# Patient Record
Sex: Female | Born: 1955
Health system: Southern US, Community
[De-identification: ages and names within clinical notes are randomized; demographics above are authoritative.]

## PROBLEM LIST (undated history)

## (undated) DIAGNOSIS — M810 Age-related osteoporosis without current pathological fracture: Secondary | ICD-10-CM

## (undated) DIAGNOSIS — E785 Hyperlipidemia, unspecified: Secondary | ICD-10-CM

## (undated) DIAGNOSIS — C801 Malignant (primary) neoplasm, unspecified: Secondary | ICD-10-CM

## (undated) DIAGNOSIS — G2581 Restless legs syndrome: Secondary | ICD-10-CM

## (undated) HISTORY — PX: ABDOMINAL HYSTERECTOMY: SHX81

## (undated) HISTORY — PX: BREAST EXCISIONAL BIOPSY: SUR124

## (undated) HISTORY — PX: SKIN CANCER EXCISION: SHX779

## (undated) HISTORY — DX: Age-related osteoporosis without current pathological fracture: M81.0

## (undated) HISTORY — DX: Restless legs syndrome: G25.81

---

## 2002-01-26 DIAGNOSIS — C801 Malignant (primary) neoplasm, unspecified: Secondary | ICD-10-CM

## 2002-01-26 HISTORY — DX: Malignant (primary) neoplasm, unspecified: C80.1

## 2002-11-29 ENCOUNTER — Encounter (INDEPENDENT_AMBULATORY_CARE_PROVIDER_SITE_OTHER): Payer: Self-pay | Admitting: *Deleted

## 2002-11-29 ENCOUNTER — Other Ambulatory Visit: Admission: RE | Admit: 2002-11-29 | Discharge: 2002-11-29 | Payer: Self-pay | Admitting: Diagnostic Radiology

## 2002-11-29 ENCOUNTER — Encounter: Admission: RE | Admit: 2002-11-29 | Discharge: 2002-11-29 | Payer: Self-pay | Admitting: Surgery

## 2002-12-04 ENCOUNTER — Ambulatory Visit (HOSPITAL_COMMUNITY): Admission: RE | Admit: 2002-12-04 | Discharge: 2002-12-04 | Payer: Self-pay | Admitting: Oncology

## 2002-12-22 ENCOUNTER — Ambulatory Visit (HOSPITAL_COMMUNITY): Admission: RE | Admit: 2002-12-22 | Discharge: 2002-12-22 | Payer: Self-pay | Admitting: Oncology

## 2002-12-28 ENCOUNTER — Observation Stay (HOSPITAL_COMMUNITY): Admission: RE | Admit: 2002-12-28 | Discharge: 2002-12-29 | Payer: Self-pay | Admitting: Surgery

## 2002-12-28 ENCOUNTER — Encounter (INDEPENDENT_AMBULATORY_CARE_PROVIDER_SITE_OTHER): Payer: Self-pay | Admitting: Specialist

## 2003-01-31 ENCOUNTER — Ambulatory Visit (HOSPITAL_COMMUNITY): Admission: RE | Admit: 2003-01-31 | Discharge: 2003-01-31 | Payer: Self-pay | Admitting: Oncology

## 2003-02-16 ENCOUNTER — Ambulatory Visit (HOSPITAL_COMMUNITY): Admission: RE | Admit: 2003-02-16 | Discharge: 2003-02-16 | Payer: Self-pay | Admitting: Oncology

## 2003-05-30 ENCOUNTER — Encounter: Admission: RE | Admit: 2003-05-30 | Discharge: 2003-05-30 | Payer: Self-pay | Admitting: Surgery

## 2004-04-30 ENCOUNTER — Encounter: Admission: RE | Admit: 2004-04-30 | Discharge: 2004-04-30 | Payer: Self-pay | Admitting: Oncology

## 2004-05-14 ENCOUNTER — Encounter: Admission: RE | Admit: 2004-05-14 | Discharge: 2004-05-14 | Payer: Self-pay | Admitting: Family Medicine

## 2005-05-11 ENCOUNTER — Encounter: Admission: RE | Admit: 2005-05-11 | Discharge: 2005-05-11 | Payer: Self-pay | Admitting: Family Medicine

## 2006-02-25 ENCOUNTER — Ambulatory Visit: Payer: Self-pay | Admitting: Oncology

## 2006-03-22 ENCOUNTER — Ambulatory Visit (HOSPITAL_COMMUNITY): Admission: RE | Admit: 2006-03-22 | Discharge: 2006-03-22 | Payer: Self-pay | Admitting: Oncology

## 2006-03-22 LAB — COMPREHENSIVE METABOLIC PANEL
ALT: 20 U/L (ref 0–35)
AST: 23 U/L (ref 0–37)
Alkaline Phosphatase: 60 U/L (ref 39–117)
CO2: 26 mEq/L (ref 19–32)
Calcium: 10.3 mg/dL (ref 8.4–10.5)
Chloride: 104 mEq/L (ref 96–112)
Creatinine, Ser: 0.75 mg/dL (ref 0.40–1.20)
Potassium: 4.1 mEq/L (ref 3.5–5.3)
Sodium: 141 mEq/L (ref 135–145)
Total Bilirubin: 0.3 mg/dL (ref 0.3–1.2)
Total Protein: 7.7 g/dL (ref 6.0–8.3)

## 2006-03-22 LAB — CBC WITH DIFFERENTIAL/PLATELET
Basophils Absolute: 0 10*3/uL (ref 0.0–0.1)
EOS%: 0.3 % (ref 0.0–7.0)
MCHC: 35.1 g/dL (ref 32.0–36.0)
MONO%: 4.6 % (ref 0.0–13.0)
NEUT%: 68.5 % (ref 39.6–76.8)
lymph#: 2 10*3/uL (ref 0.9–3.3)

## 2006-03-22 LAB — LACTATE DEHYDROGENASE: LDH: 201 U/L (ref 94–250)

## 2006-05-17 ENCOUNTER — Encounter: Admission: RE | Admit: 2006-05-17 | Discharge: 2006-05-17 | Payer: Self-pay | Admitting: Family Medicine

## 2006-10-13 ENCOUNTER — Ambulatory Visit: Payer: Self-pay | Admitting: Oncology

## 2006-10-18 ENCOUNTER — Ambulatory Visit (HOSPITAL_COMMUNITY): Admission: RE | Admit: 2006-10-18 | Discharge: 2006-10-18 | Payer: Self-pay | Admitting: Oncology

## 2006-10-18 LAB — LACTATE DEHYDROGENASE: LDH: 187 U/L (ref 94–250)

## 2007-04-14 ENCOUNTER — Ambulatory Visit: Payer: Self-pay | Admitting: Oncology

## 2007-04-18 ENCOUNTER — Ambulatory Visit (HOSPITAL_COMMUNITY): Admission: RE | Admit: 2007-04-18 | Discharge: 2007-04-18 | Payer: Self-pay | Admitting: Oncology

## 2007-04-18 LAB — CBC WITH DIFFERENTIAL/PLATELET
EOS%: 2.2 % (ref 0.0–7.0)
HCT: 44.6 % (ref 34.8–46.6)
LYMPH%: 32.5 % (ref 14.0–48.0)
MCH: 30.7 pg (ref 26.0–34.0)
MCV: 89.7 fL (ref 81.0–101.0)
MONO#: 0.3 10*3/uL (ref 0.1–0.9)
NEUT#: 4 10*3/uL (ref 1.5–6.5)
NEUT%: 60.5 % (ref 39.6–76.8)
Platelets: 313 10*3/uL (ref 145–400)
RDW: 13.2 % (ref 11.3–14.5)
lymph#: 2.1 10*3/uL (ref 0.9–3.3)

## 2007-04-18 LAB — COMPREHENSIVE METABOLIC PANEL WITH GFR
ALT: 16 U/L (ref 0–35)
AST: 21 U/L (ref 0–37)
Albumin: 4.8 g/dL (ref 3.5–5.2)
Alkaline Phosphatase: 65 U/L (ref 39–117)
BUN: 10 mg/dL (ref 6–23)
CO2: 24 meq/L (ref 19–32)
Calcium: 10.3 mg/dL (ref 8.4–10.5)
Chloride: 103 meq/L (ref 96–112)
Creatinine, Ser: 0.89 mg/dL (ref 0.40–1.20)
Glucose, Bld: 97 mg/dL (ref 70–99)
Potassium: 4.2 meq/L (ref 3.5–5.3)
Sodium: 139 meq/L (ref 135–145)
Total Bilirubin: 0.4 mg/dL (ref 0.3–1.2)
Total Protein: 7.9 g/dL (ref 6.0–8.3)

## 2007-10-13 ENCOUNTER — Ambulatory Visit: Payer: Self-pay | Admitting: Oncology

## 2007-10-17 ENCOUNTER — Ambulatory Visit (HOSPITAL_COMMUNITY): Admission: RE | Admit: 2007-10-17 | Discharge: 2007-10-17 | Payer: Self-pay | Admitting: Oncology

## 2007-10-17 LAB — CBC WITH DIFFERENTIAL/PLATELET
BASO%: 0.6 % (ref 0.0–2.0)
Basophils Absolute: 0 10*3/uL (ref 0.0–0.1)
Eosinophils Absolute: 0.1 10*3/uL (ref 0.0–0.5)
HGB: 14.9 g/dL (ref 11.6–15.9)
NEUT#: 4 10*3/uL (ref 1.5–6.5)
NEUT%: 61.9 % (ref 39.6–76.8)
Platelets: 260 10*3/uL (ref 145–400)
WBC: 6.4 10*3/uL (ref 3.9–10.0)

## 2007-10-17 LAB — COMPREHENSIVE METABOLIC PANEL
ALT: 15 U/L (ref 0–35)
AST: 18 U/L (ref 0–37)
CO2: 24 mEq/L (ref 19–32)
Calcium: 9.5 mg/dL (ref 8.4–10.5)
Chloride: 103 mEq/L (ref 96–112)
Glucose, Bld: 94 mg/dL (ref 70–99)
Potassium: 4.4 mEq/L (ref 3.5–5.3)
Total Bilirubin: 0.4 mg/dL (ref 0.3–1.2)
Total Protein: 7.6 g/dL (ref 6.0–8.3)

## 2007-11-14 ENCOUNTER — Encounter: Admission: RE | Admit: 2007-11-14 | Discharge: 2007-11-14 | Payer: Self-pay | Admitting: Oncology

## 2008-10-11 ENCOUNTER — Ambulatory Visit: Payer: Self-pay | Admitting: Oncology

## 2008-10-15 ENCOUNTER — Ambulatory Visit (HOSPITAL_COMMUNITY): Admission: RE | Admit: 2008-10-15 | Discharge: 2008-10-15 | Payer: Self-pay | Admitting: Oncology

## 2008-10-15 LAB — COMPREHENSIVE METABOLIC PANEL
ALT: 19 U/L (ref 0–35)
AST: 21 U/L (ref 0–37)
BUN: 12 mg/dL (ref 6–23)
CO2: 26 mEq/L (ref 19–32)
Calcium: 9.6 mg/dL (ref 8.4–10.5)
Creatinine, Ser: 0.87 mg/dL (ref 0.40–1.20)
Potassium: 4.6 mEq/L (ref 3.5–5.3)
Total Protein: 7.5 g/dL (ref 6.0–8.3)

## 2008-10-15 LAB — CBC WITH DIFFERENTIAL/PLATELET
Eosinophils Absolute: 0.2 10*3/uL (ref 0.0–0.5)
LYMPH%: 30.8 % (ref 14.0–49.7)
MCH: 31.1 pg (ref 25.1–34.0)
MCHC: 33.8 g/dL (ref 31.5–36.0)
MCV: 92.1 fL (ref 79.5–101.0)
MONO#: 0.5 10*3/uL (ref 0.1–0.9)
MONO%: 6.8 % (ref 0.0–14.0)
NEUT%: 59.1 % (ref 38.4–76.8)
Platelets: 277 10*3/uL (ref 145–400)
WBC: 6.8 10*3/uL (ref 3.9–10.3)
lymph#: 2.1 10*3/uL (ref 0.9–3.3)

## 2008-10-15 LAB — LACTATE DEHYDROGENASE: LDH: 207 U/L (ref 94–250)

## 2008-11-14 ENCOUNTER — Encounter: Admission: RE | Admit: 2008-11-14 | Discharge: 2008-11-14 | Payer: Self-pay | Admitting: Family Medicine

## 2009-02-19 ENCOUNTER — Encounter: Admission: RE | Admit: 2009-02-19 | Discharge: 2009-02-19 | Payer: Self-pay | Admitting: Family Medicine

## 2009-10-02 ENCOUNTER — Ambulatory Visit: Payer: Self-pay | Admitting: Oncology

## 2009-10-30 ENCOUNTER — Ambulatory Visit (HOSPITAL_COMMUNITY): Admission: RE | Admit: 2009-10-30 | Discharge: 2009-10-30 | Payer: Self-pay | Admitting: Oncology

## 2009-10-30 LAB — COMPREHENSIVE METABOLIC PANEL
ALT: 22 U/L (ref 0–35)
AST: 28 U/L (ref 0–37)
CO2: 24 mEq/L (ref 19–32)
Creatinine, Ser: 0.87 mg/dL (ref 0.40–1.20)
Glucose, Bld: 91 mg/dL (ref 70–99)
Sodium: 140 mEq/L (ref 135–145)
Total Bilirubin: 0.3 mg/dL (ref 0.3–1.2)
Total Protein: 6.8 g/dL (ref 6.0–8.3)

## 2009-10-30 LAB — CBC WITH DIFFERENTIAL/PLATELET
EOS%: 2.7 % (ref 0.0–7.0)
Eosinophils Absolute: 0.2 10*3/uL (ref 0.0–0.5)
HCT: 41.4 % (ref 34.8–46.6)
HGB: 14.3 g/dL (ref 11.6–15.9)
LYMPH%: 32 % (ref 14.0–49.7)
MCH: 32.1 pg (ref 25.1–34.0)
MCHC: 34.4 g/dL (ref 31.5–36.0)
NEUT#: 4.2 10*3/uL (ref 1.5–6.5)
RDW: 13.2 % (ref 11.2–14.5)

## 2009-10-30 LAB — LACTATE DEHYDROGENASE: LDH: 210 U/L (ref 94–250)

## 2009-11-01 ENCOUNTER — Ambulatory Visit: Payer: Self-pay | Admitting: Oncology

## 2009-11-20 ENCOUNTER — Encounter: Admission: RE | Admit: 2009-11-20 | Discharge: 2009-11-20 | Payer: Self-pay | Admitting: Family Medicine

## 2010-02-15 ENCOUNTER — Encounter: Payer: Self-pay | Admitting: Oncology

## 2010-02-15 ENCOUNTER — Encounter: Payer: Self-pay | Admitting: Surgery

## 2010-02-16 ENCOUNTER — Encounter: Payer: Self-pay | Admitting: Oncology

## 2010-06-13 NOTE — Op Note (Signed)
NAME:  Taylor Craig, Taylor Craig                        ACCOUNT NO.:  [id number]   MEDICAL RECORD NO.:  [id number]                   PATIENT TYPE:  OBV   LOCATION:  0447                                 FACILITY:  [facility]   PHYSICIAN:  Velora Heckler, M.D.                DATE OF BIRTH:  Mar 07, 1955   DATE OF PROCEDURE:  12/28/2002  DATE OF DISCHARGE:                                 OPERATIVE REPORT   PREOPERATIVE DIAGNOSIS:  Metastatic malignant melanoma.   POSTOPERATIVE DIAGNOSIS:  Metastatic malignant melanoma.   PROCEDURE:  Left axillary lymph node dissection.   SURGEON:  Velora Heckler, M.D.   ANESTHESIA:  General per Dr. Almeta Monas.   ESTIMATED BLOOD LOSS:  Minimal.   PREPARATION:  Betadine.   COMPLICATIONS:  None.   INDICATIONS FOR PROCEDURE:  The patient is a 55 year old white female seen  in my practice for axillary lymphadenopathy. The patient had underlying  fibrocystic disease of the breast. She had a distant history of malignant  melanoma excision from the left shoulder. On ultrasound of the breast, an  abnormal axillary lymph node was identified. She underwent fine needle  aspiration. This showed metastatic malignant melanoma. The patient now comes  to surgery for left axillary lymph node dissection.   DESCRIPTION OF PROCEDURE:  The procedure was done on OR #11 at the [facility]. The patient was brought to the operating room,  placed in a supine position on the operating room table. Following the  administration of general anesthesia, the patient is positioned and then  prepped and draped in the usual strict aseptic fashion. After ascertaining  that an adequate of anesthesia had been obtained, a high axillary incision  is made with a #10 blade. Dissection was carried down through the  subcutaneous tissues. The lateral edge of the pectoralis major muscle is  identified.  Dissection is carried down through the clavicopectoral fascia  and the true axilla is  entered. Dissection is carried cephalad along the  edge of the pectoralis major muscle. The axillary vein is identified and  preserved. Dissection is continued along the inferior edge of the axillary  vein. Venous tributaries are divided between medium Ligaclips. The long  thoracic nerve and thoracodorsal nerves are identified and preserved,  thoracodorsal vessels are identified and preserved. High in the axilla,  there is approximately a 2 1/2 cm firm discrete lymph node. This was  dissected out with the axillary contents. Dissection is carried down the  chest wall taking care to preserve the long thoracic nerve. Dissection is  carried posteriorly to the subscapularis musculature. The entire axillary  contents are then carefully swept out. The subcutaneous tissues are divided  with the electrocautery. Axillary contents are completely excised. This is  submitted to pathology in formalin as per the instructions of the  pathologist. Axilla is irrigated, good hemostasis is noted. Stimulation of  both long thoracic and thoracodorsal nerves  show intact function. A 19  J[place] Blake drain is brought in from an inferior stab wound and placed in  the base of the wound. It is secured to the skin with a 3-0 nylon suture and  placed to bulb suction. The subcutaneous tissues are closed with  interrupted 3-0 Vicryl sutures. The skin is closed with stainless steel  staples. Sterile dressings are applied. The patient is awakened from  anesthesia and brought to the recovery room in stable condition. The patient  tolerated the procedure well.                                               Velora Heckler, M.D.    TMG/MEDQ  D:  12/28/2002  T:  12/29/2002  Job:  [id number]   cc:   Tammy R. Collins [place], M.D.  [address]  [place]  [place] [address]  Fax: [phone]   Genene Churn. Cyndie Chime, M.D.  [address] [facility]  [place]  [place] [address]  Fax: [phone]

## 2010-11-14 ENCOUNTER — Other Ambulatory Visit: Payer: Self-pay | Admitting: Family Medicine

## 2010-11-14 DIAGNOSIS — Z1231 Encounter for screening mammogram for malignant neoplasm of breast: Secondary | ICD-10-CM

## 2010-12-03 ENCOUNTER — Ambulatory Visit: Payer: Self-pay

## 2011-01-21 ENCOUNTER — Ambulatory Visit: Payer: Self-pay

## 2011-02-04 ENCOUNTER — Ambulatory Visit
Admission: RE | Admit: 2011-02-04 | Discharge: 2011-02-04 | Disposition: A | Discharge status: Home / Self Care | Payer: Self-pay | Source: Ambulatory Visit | Attending: Family Medicine | Admitting: Family Medicine

## 2011-02-04 DIAGNOSIS — Z1231 Encounter for screening mammogram for malignant neoplasm of breast: Secondary | ICD-10-CM

## 2011-09-23 ENCOUNTER — Telehealth: Payer: Self-pay | Admitting: Oncology

## 2011-09-23 NOTE — Telephone Encounter (Signed)
Pt called and wants to see MD appt made for November 2013 lab and chest x-ray then MD after a few days, faxed x-ray order to Radiology scheduling

## 2011-11-23 ENCOUNTER — Telehealth: Payer: Self-pay | Admitting: Oncology

## 2011-11-23 NOTE — Telephone Encounter (Signed)
Pt called and cancelled lab and MD appt for 11/1/and 11/4, nurse notified, pt does not want to r/s appt for now

## 2011-11-27 ENCOUNTER — Other Ambulatory Visit: Payer: BC Managed Care – PPO | Admitting: Lab

## 2011-11-30 ENCOUNTER — Ambulatory Visit: Payer: BC Managed Care – PPO | Admitting: Oncology

## 2012-03-07 ENCOUNTER — Other Ambulatory Visit: Payer: Self-pay | Admitting: Family Medicine

## 2012-03-07 DIAGNOSIS — Z1231 Encounter for screening mammogram for malignant neoplasm of breast: Secondary | ICD-10-CM

## 2012-04-06 ENCOUNTER — Ambulatory Visit
Admission: RE | Admit: 2012-04-06 | Discharge: 2012-04-06 | Disposition: A | Discharge status: Home / Self Care | Payer: BC Managed Care – PPO | Source: Ambulatory Visit | Attending: Family Medicine | Admitting: Family Medicine

## 2012-04-06 DIAGNOSIS — Z1231 Encounter for screening mammogram for malignant neoplasm of breast: Secondary | ICD-10-CM

## 2013-03-01 ENCOUNTER — Other Ambulatory Visit: Payer: Self-pay

## 2013-03-01 DIAGNOSIS — Z1231 Encounter for screening mammogram for malignant neoplasm of breast: Secondary | ICD-10-CM

## 2013-04-12 ENCOUNTER — Ambulatory Visit: Payer: BC Managed Care – PPO

## 2013-04-19 ENCOUNTER — Ambulatory Visit
Admission: RE | Admit: 2013-04-19 | Discharge: 2013-04-19 | Disposition: A | Discharge status: Home / Self Care | Payer: Self-pay | Source: Ambulatory Visit

## 2013-04-19 DIAGNOSIS — Z1231 Encounter for screening mammogram for malignant neoplasm of breast: Secondary | ICD-10-CM

## 2013-07-20 DIAGNOSIS — R748 Abnormal levels of other serum enzymes: Secondary | ICD-10-CM | POA: Insufficient documentation

## 2014-03-09 ENCOUNTER — Other Ambulatory Visit: Payer: Self-pay

## 2014-03-09 DIAGNOSIS — Z1231 Encounter for screening mammogram for malignant neoplasm of breast: Secondary | ICD-10-CM

## 2014-04-25 ENCOUNTER — Ambulatory Visit
Admission: RE | Admit: 2014-04-25 | Discharge: 2014-04-25 | Disposition: A | Discharge status: Home / Self Care | Payer: 59 | Source: Ambulatory Visit

## 2014-04-25 DIAGNOSIS — Z1231 Encounter for screening mammogram for malignant neoplasm of breast: Secondary | ICD-10-CM

## 2014-07-05 ENCOUNTER — Other Ambulatory Visit: Payer: Self-pay | Admitting: Nurse Practitioner

## 2014-07-05 DIAGNOSIS — M542 Cervicalgia: Secondary | ICD-10-CM

## 2014-07-10 ENCOUNTER — Other Ambulatory Visit: Payer: 59

## 2014-07-16 ENCOUNTER — Emergency Department (HOSPITAL_COMMUNITY): Payer: 59

## 2014-07-16 ENCOUNTER — Other Ambulatory Visit: Payer: 59

## 2014-07-16 ENCOUNTER — Encounter (HOSPITAL_COMMUNITY): Payer: Self-pay | Admitting: Emergency Medicine

## 2014-07-16 ENCOUNTER — Emergency Department (HOSPITAL_COMMUNITY)
Admission: EM | Admit: 2014-07-16 | Discharge: 2014-07-16 | Disposition: A | Discharge status: Home / Self Care | Payer: 59 | Attending: Emergency Medicine | Admitting: Emergency Medicine

## 2014-07-16 DIAGNOSIS — H539 Unspecified visual disturbance: Secondary | ICD-10-CM | POA: Diagnosis not present

## 2014-07-16 DIAGNOSIS — M542 Cervicalgia: Secondary | ICD-10-CM

## 2014-07-16 DIAGNOSIS — Z85820 Personal history of malignant melanoma of skin: Secondary | ICD-10-CM | POA: Diagnosis not present

## 2014-07-16 DIAGNOSIS — M5030 Other cervical disc degeneration, unspecified cervical region: Secondary | ICD-10-CM | POA: Insufficient documentation

## 2014-07-16 DIAGNOSIS — Z79899 Other long term (current) drug therapy: Secondary | ICD-10-CM | POA: Insufficient documentation

## 2014-07-16 DIAGNOSIS — E785 Hyperlipidemia, unspecified: Secondary | ICD-10-CM | POA: Insufficient documentation

## 2014-07-16 HISTORY — DX: Malignant (primary) neoplasm, unspecified: C80.1

## 2014-07-16 HISTORY — DX: Hyperlipidemia, unspecified: E78.5

## 2014-07-16 MED ORDER — HYDROCODONE-ACETAMINOPHEN 5-325 MG PO TABS: 2.0000 | ORAL_TABLET | ORAL | Status: DC | PRN

## 2014-07-16 MED ORDER — PREDNISONE 10 MG PO TABS
ORAL_TABLET | ORAL | Status: DC
Start: 2014-07-16 — End: 2014-09-25

## 2014-07-16 NOTE — ED Provider Notes (Signed)
CSN: [id number]     Arrival date & time 07/16/14  1025 History   First MD Initiated Contact with Patient 07/16/14 1053    This chart was scribed for non-physician practitioner, Hollace Kinnier. Taylor Craig, working with Noemi Chapel, MD by Terressa Koyanagi, ED Scribe. This patient was seen in room APA17/APA17 and the patient's care was started at 11:13 AM.  Chief Complaint  Patient presents with  . Neck Pain   The history is provided by the patient. No language interpreter was used.   PCP: Florina Ou, MD HPI Comments: Taylor Craig is a 59 y.o. female, with PMH noted below, who presents to the Emergency Department complaining of sudden onset, intermittent, ongoing neck pain with associated blurred vision onset 3 weeks ago. Pt notes each episode lasts only a few minutes and it starts with the neck pain, with blurred vision following, before the Sx resolve. Pt reports treating her Sx with ibuprofen and muscle relaxers at home with some relief. Pt further notes that she was seen for the same by her PCP who prescribed her the muscle relaxer; pt's PCP did not order imaging, however, and advised pt she needs a MRI. Pt has not gotten a MRI to date due to insurance coverage issues. Pt denies chest pain, abd pain, weakness, SOB, numbness in UE, known allergies to any meds.    Past Medical History  Diagnosis Date  . Hyperlipemia   . Cancer 2004    melanoma    Past Surgical History  Procedure Laterality Date  . Skin cancer excision    . Abdominal hysterectomy     History reviewed. No pertinent family history. History  Substance Use Topics  . Smoking status: Never Smoker   . Smokeless tobacco: Not on file  . Alcohol Use: No   OB History    Gravida Para Term Preterm AB TAB SAB Ectopic Multiple Living            2     Review of Systems  Constitutional: Negative for fever and chills.  Eyes: Positive for visual disturbance.  Respiratory: Negative for shortness of breath.   Cardiovascular: Negative  for chest pain.  Gastrointestinal: Negative for abdominal pain.  Musculoskeletal: Positive for neck pain.  Neurological: Negative for weakness and numbness.   Allergies  Review of patient's allergies indicates no known allergies.  Home Medications   Prior to Admission medications   Medication Sig Start Date End Date Taking? Authorizing Provider  atorvastatin (LIPITOR) 80 MG tablet Take 80 mg by mouth daily.   Yes Historical Provider, MD  cyclobenzaprine (FLEXERIL) 10 MG tablet Take 1 tablet by mouth 3 (three) times daily as needed. 07/04/14  Yes Historical Provider, MD  ibuprofen (ADVIL,MOTRIN) 800 MG tablet Take 1 tablet by mouth 3 (three) times daily as needed. 07/04/14  Yes Historical Provider, MD  Multiple Vitamin (MULTIVITAMIN WITH MINERALS) TABS tablet Take 1 tablet by mouth daily.   Yes Historical Provider, MD   Triage Vitals: BP 158/88 mmHg  Pulse 105  Temp(Src) 98.2 F (36.8 C) (Oral)  Resp 16  Ht 5\' 1"  (1.549 m)  Wt 150 lb (68.04 kg)  BMI 28.36 kg/m2  SpO2 97% Physical Exam  Constitutional: She is oriented to person, place, and time. She appears well-developed and well-nourished. No distress.  HENT:  Head: Normocephalic and atraumatic.  Eyes: Conjunctivae and EOM are normal.  Neck: Neck supple.  Cardiovascular: Normal rate.   Pulmonary/Chest: Effort normal. No respiratory distress.  Musculoskeletal: Normal range of  motion. She exhibits tenderness (Mild neck pain with range of motion ). She exhibits no edema.  Neurological: She is alert and oriented to person, place, and time.  Skin: Skin is warm and dry.  Red area to occipital scalp and upper neck.   Psychiatric: She has a normal mood and affect. Her behavior is normal.  Nursing note and vitals reviewed.   ED Course  Procedures (including critical care time) DIAGNOSTIC STUDIES: Oxygen Saturation is 97% on RA, nl by my interpretation.    COORDINATION OF CARE: 11:17 AM-Discussed treatment plan which includes  imaging with pt at bedside and pt agreed to plan.   Labs Review Labs Reviewed - No data to display  Imaging Review Dg Cervical Spine Complete  07/16/2014   CLINICAL DATA:  Posterior neck pain going down between shoulder blades for 2.5-3.0 weeks, tingling at base of skull starting this morning, no known injury  EXAM: CERVICAL SPINE  4+ VIEWS  COMPARISON:  None  FINDINGS: Bones appear demineralized.  Prevertebral soft tissues normal thickness.  Disc space narrowing C3-C4 through C7-T1.  Vertebral body heights maintained without fracture or subluxation.  Encroachment upon BILATERAL cervical neural foramina at C5-C6 by uncovertebral spurs.  Lung apices clear.  C1-C2 alignment normal.  IMPRESSION: Scattered degenerative disc disease changes with encroachment upon BILATERAL cervical neural foramina at C5-C6 by uncovertebral spurs.  No acute bony abnormalities.   Electronically Signed   By: Lavonia Dana M.D.   On: 07/16/2014 11:59   Mr Cervical Spine Wo Contrast  07/16/2014   CLINICAL DATA:  Neck pain for the past 2-3 weeks. History of melanoma excision.  EXAM: MRI CERVICAL SPINE WITHOUT CONTRAST  TECHNIQUE: Multiplanar, multisequence MR imaging of the cervical spine was performed. No intravenous contrast was administered.  COMPARISON:  07/16/2014  FINDINGS: The craniocervical junction appears unremarkable. There is 1.5 mm degenerative anterolisthesis at C2-3. Type 2 degenerative endplate findings at D6-6.  Disc desiccation noted throughout cervical spine with loss of disc height especially at C4-5, C5-6, and C6-7.  No significant abnormal spinal cord signal is observed. No significant vertebral marrow edema is identified. Additional findings at individual levels are as follows:  C2-3:  Unremarkable.  C3-4: Mild right foraminal stenosis and borderline central narrowing of the thecal sac due to uncinate spurring and disc bulge.  C4-5: Mild bilateral foraminal stenosis and borderline central narrowing of the thecal  sac due to uncinate spurring and disc bulge.  C5-6: Moderate bilateral foraminal stenosis and mild right eccentric central narrowing of the thecal sac due to intervertebral spurring and disc bulge.  C6-7: Mild bilateral foraminal stenosis and borderline central narrowing of the thecal sac due to disc bulge.  C7-T1:  Unremarkable.  IMPRESSION: 1. Cervical spondylosis and degenerative disc disease, causing moderate impingement at C5-6 and mild impingement at C3-4, C4-5, and C6-7, as detailed above.   Electronically Signed   By: Van Clines M.D.   On: 07/16/2014 14:35     EKG Interpretation None      MDM  Dr. Sabra Heck in to see  Mri shows spondylosis and degenerative changes with impingement.    Pt given rx for prednisone taper, hydrocodone,  Pt advised to follow up with her primary for recheck.   Final diagnoses:  Neck pain  Degenerative disc disease, cervical     I personally performed the services in this documentation, which was scribed in my presence.  The recorded information has been reviewed and considered.   Ronnald Collum.  Fransico Meadow,  PA-C 07/16/14 1504  Noemi Chapel, MD 07/18/14 1825

## 2014-07-16 NOTE — ED Notes (Signed)
Patient with no complaints at this time. Respirations even and unlabored. Skin warm/dry. Discharge instructions reviewed with patient at this time. Patient given opportunity to voice concerns/ask questions. Patient discharged at this time and left Emergency Department with steady gait.   

## 2014-07-16 NOTE — ED Provider Notes (Signed)
The patient is a 59 year old female, she presents to the hospital with a complaint of neck pain, this has been going on for approximately 3 weeks since she was in bed and moved her head to the side quickly, felt acute onset of pain, it has been present since that time. Initially she had onset of facial pain and eye pain with blurred vision but this only lasted a couple of minutes and then resolved leaving her with constant neck pain since. She denies numbness or weakness of her upper extremities. On exam the patient has mild tenderness along the posterior neck, she is very rigid in the neck, she does not want to move it from side to side. She has clear heart and lung sounds, normal neurologic exam of the upper extremities including strength and sensation. She has normal gait, soft nontender abdomen. We'll further evaluate her neck with cervical spine imaging, anticipate discharge if negative with muscular treatment.  Medical screening examination/treatment/procedure(s) were conducted as a shared visit with non-physician practitioner(s) and myself.  I personally evaluated the patient during the encounter.  Clinical Impression:   Final diagnoses:  Neck pain  Degenerative disc disease, cervical         Noemi Chapel, MD 07/18/14 1825

## 2014-07-16 NOTE — Discharge Instructions (Signed)

## 2014-07-16 NOTE — ED Notes (Signed)
PT c/o neck pain x3 weeks with some relief from muscle relaxers and ibuprofen at home and denies any weakness.

## 2014-08-23 ENCOUNTER — Ambulatory Visit: Payer: 59 | Attending: Rehabilitation | Admitting: Physical Therapy

## 2014-08-23 DIAGNOSIS — M436 Torticollis: Secondary | ICD-10-CM | POA: Insufficient documentation

## 2014-08-23 DIAGNOSIS — M542 Cervicalgia: Secondary | ICD-10-CM

## 2014-08-23 NOTE — Therapy (Signed)
[facility] [place], [place], [address] Phone: [phone]   Fax:  [phone]  Physical Therapy Evaluation  Patient Details  Name: Taylor Craig MRN: [id number] Date of Birth: Aug 03, 1955 Referring Provider:  Zonia Kief, MD  Encounter Date: 08/23/2014      PT End of Session - 08/23/14 1750    Visit Number 1   Number of Visits 12   Date for PT Re-Evaluation 10/04/14   PT Start Time 0100   PT Stop Time 0148   PT Time Calculation (min) 48 min   Activity Tolerance Patient tolerated treatment well   Behavior During Therapy [facility] for tasks assessed/performed      Past Medical History  Diagnosis Date  . Hyperlipemia   . Cancer 2004    melanoma     Past Surgical History  Procedure Laterality Date  . Skin cancer excision    . Abdominal hysterectomy      There were no vitals filed for this visit.  Visit Diagnosis:  Neck pain - Plan: PT plan of care cert/re-cert  Neck stiffness - Plan: PT plan of care cert/re-cert      Subjective Assessment - 08/23/14 1746    Subjective Neck just started hurting after moving in bed in June.   Patient Stated Goals Get out of pain.   Currently in Pain? Yes   Pain Score 5    Pain Location Neck   Pain Orientation Posterior   Pain Descriptors / Indicators Stabbing;Shooting;Sharp   Pain Type --  Sub-acute.   Pain Onset 1 to 4 weeks ago   Pain Frequency Constant   Aggravating Factors  Certain movements.   Pain Relieving Factors Not moving neck.            [facility] PT Assessment - 08/23/14 0001    Assessment   Medical Diagnosis Spondylosis without myleopathy, cervical region.   Onset Date/Surgical Date --  June 2016.   Precautions   Precautions None   Restrictions   Weight Bearing Restrictions No   Balance Screen   Has the patient fallen in the past 6 months No   Has the patient had a decrease in activity level because of a fear of falling?  No   Is the patient  reluctant to leave their home because of a fear of falling?  No   Home Environment   Living Environment Private residence   Prior Function   Level of Independence Independent   Posture/Postural Control   Posture/Postural Control Postural limitations   Postural Limitations Rounded Shoulders;Forward head   Posture Comments Prominent C7 with significant forward head and hyperlordosis.   ROM / Strength   AROM / PROM / Strength AROM;Strength   AROM   Overall AROM Comments Bil active cervical rotation= 55 degrees.   Strength   Overall Strength Comments Normal bil UE strength.   Palpation   Palpation comment Pain complaints "in" mid-cervical spine with radition of pain over bilateral UT's.  Palpable pain over right suboccipital musculature.   Special Tests    Special Tests --  Normal UE DTR's.                   [facility] Adult PT Treatment/Exercise - 08/23/14 0001    Exercises   Exercises --   Modalities   Modalities Electrical Stimulation   Electrical Stimulation   Electrical Stimulation Location --  Neck.   Electrical Stimulation Action --  IFC at 80-150HZ  x 15 minutes.  PT Short Term Goals - 2014/08/26 1758    PT SHORT TERM GOAL #1   Title Ind with HEP.   Time 2   Period Weeks   Status New           PT Long Term Goals - 08-26-2014 1758    PT LONG TERM GOAL #1   Title Bil active cervical rotation= 65 degrees so she can turn her head more easily while driving.   Time 4   Period Weeks   Status New   PT LONG TERM GOAL #2   Title Eliminate headaches.   Time 4   Period Weeks   Status New   PT LONG TERM GOAL #3   Title Perform cervical movements with pain not > 3/10.   Time 4   Period Weeks   Status New   PT LONG TERM GOAL #4   Title Perform ADL's with pain not > 3/10.               Plan - Aug 26, 2014 1754    Clinical Impression Statement The patient moved in bed in June and began to experience severe neck pain that has not  gome away.  Pain ~ 5/10 today.  She states her pain is "in" her spine and pain radiates into bilateral shoulders.  Patient experiences headaches.   Pt will benefit from skilled therapeutic intervention in order to improve on the following deficits Pain;Decreased range of motion   Rehab Potential Good   PT Frequency 3x / week   PT Duration 4 weeks   PT Treatment/Interventions Electrical Stimulation;Ultrasound;Moist Heat;Therapeutic exercise;Manual techniques;Passive range of motion;Traction   PT Next Visit Plan In supine STW/M and manual traction.  Suboccipital release.  May try int cervical traction at 15#.  Modalites and gentle McKenzie exercises.          G-Codes - 08-26-2014 1801    Functional Assessment Tool Used Clinical judgement.   Functional Limitation Mobility: Walking and moving around   Mobility: Walking and Moving Around Current Status [phone]) At least 40 percent but less than 60 percent impaired, limited or restricted   Mobility: Walking and Moving Around Goal Status [phone]) At least 20 percent but less than 40 percent impaired, limited or restricted       Problem List There are no active problems to display for this patient.   Ludie Pavlik, [place] MPT 08/26/2014, 6:03 PM  [facility] [address] [place], [place], [address] Phone: [phone]   Fax:  [phone]

## 2014-08-27 ENCOUNTER — Ambulatory Visit: Payer: 59 | Attending: Rehabilitation | Admitting: *Deleted

## 2014-08-27 ENCOUNTER — Encounter: Payer: Self-pay | Admitting: *Deleted

## 2014-08-27 DIAGNOSIS — M542 Cervicalgia: Secondary | ICD-10-CM

## 2014-08-27 DIAGNOSIS — M436 Torticollis: Secondary | ICD-10-CM | POA: Diagnosis present

## 2014-08-27 NOTE — Therapy (Signed)
[facility] [place], [place], [address] Phone: [phone]   Fax:  [phone]  Physical Therapy Treatment  Patient Details  Name: Taylor Craig MRN: [id number] Date of Birth: May 18, 1955 Referring Provider:  Florina Ou, MD  Encounter Date: 08/27/2014      PT End of Session - 08/27/14 1347    Visit Number 2   Date for PT Re-Evaluation 10/04/14   PT Start Time 1300   PT Stop Time 5329   PT Time Calculation (min) 49 min      Past Medical History  Diagnosis Date  . Hyperlipemia   . Cancer 2004    melanoma     Past Surgical History  Procedure Laterality Date  . Skin cancer excision    . Abdominal hysterectomy      There were no vitals filed for this visit.  Visit Diagnosis:  Neck pain  Neck stiffness      Subjective Assessment - 08/27/14 1300    Subjective Neck just started hurting after moving in bed in June.   Patient Stated Goals Get out of pain.   Pain Score 4    Pain Location Neck   Pain Orientation Posterior   Pain Descriptors / Indicators Stabbing;Shooting;Sharp   Pain Onset 1 to 4 weeks ago   Pain Frequency Constant   Aggravating Factors  certain movements   Pain Relieving Factors not moving neck                         [facility] Adult PT Treatment/Exercise - 08/27/14 0001    Neck Exercises: Standing   Neck Retraction 10 reps;3 secs  and posture stretches   Modalities   Modalities Electrical Stimulation;Ultrasound;Moist Heat   Moist Heat Therapy   Number Minutes Moist Heat 15 Minutes   Moist Heat Location Cervical   Electrical Stimulation   Electrical Stimulation Location IFC x 15 mins to bil. cervical paras supine  Neck.   Ultrasound   Ultrasound Location cervical paras   Ultrasound Parameters 1.5 w/cm2 x 10 mins    Ultrasound Goals Pain   Manual Therapy   Manual Therapy Myofascial release;Manual Traction   Myofascial Release STW to bil Utraps and cervical paras  sitting   Manual Traction Gentle manual cervical traction supine                  PT Short Term Goals - 08/23/14 1758    PT SHORT TERM GOAL #1   Title Ind with HEP.   Time 2   Period Weeks   Status New           PT Long Term Goals - 08/23/14 1758    PT LONG TERM GOAL #1   Title Bil active cervical rotation= 65 degrees so she can turn her head more easily while driving.   Time 4   Period Weeks   Status New   PT LONG TERM GOAL #2   Title Eliminate headaches.   Time 4   Period Weeks   Status New   PT LONG TERM GOAL #3   Title Perform cervical movements with pain not > 3/10.   Time 4   Period Weeks   Status New   PT LONG TERM GOAL #4   Title Perform ADL's with pain not > 3/10.               Plan - 08/27/14 1259    Clinical Impression Statement Pt did  fairly well today with Rx, but is apprehensive with manual STW and traction. She was able to relax with VC's minimal pressure used with STW. She did well with chintucks after practice and cues.   Pt will benefit from skilled therapeutic intervention in order to improve on the following deficits Pain;Decreased range of motion   Rehab Potential Good   PT Frequency 3x / week   PT Duration 4 weeks   PT Treatment/Interventions Electrical Stimulation;Ultrasound;Moist Heat;Therapeutic exercise;Manual techniques;Passive range of motion;Traction   PT Next Visit Plan In supine STW/M and manual traction.  Suboccipital release.  May try int cervical traction at 15#.  Modalites and gentle McKenzie exercises.   Consulted and Agree with Plan of Care Patient        Problem List There are no active problems to display for this patient.   Neveen Daponte,CHRIS, PTA 08/27/2014, 2:54 PM  [facility] [address] [place], [place], [address] Phone: [phone]   Fax:  [phone]

## 2014-08-29 ENCOUNTER — Ambulatory Visit: Payer: 59 | Admitting: Physical Therapy

## 2014-08-29 DIAGNOSIS — M436 Torticollis: Secondary | ICD-10-CM

## 2014-08-29 DIAGNOSIS — M542 Cervicalgia: Secondary | ICD-10-CM | POA: Diagnosis not present

## 2014-08-29 NOTE — Therapy (Signed)
[facility] [place], [place], [address] Phone: [phone]   Fax:  [phone]  Physical Therapy Treatment  Patient Details  Name: Taylor Craig MRN: [id number] Date of Birth: 1955/03/07 Referring Provider:  Florina Ou, MD  Encounter Date: 08/29/2014      PT End of Session - 08/29/14 1347    Visit Number 3   Number of Visits 12   PT Start Time 0103   PT Stop Time 0146   PT Time Calculation (min) 43 min   Activity Tolerance Patient tolerated treatment well  Patient receved treatment in massage chair and felt much better after treatment.   Behavior During Therapy [facility] for tasks assessed/performed      Past Medical History  Diagnosis Date  . Hyperlipemia   . Cancer 2004    melanoma     Past Surgical History  Procedure Laterality Date  . Skin cancer excision    . Abdominal hysterectomy      There were no vitals filed for this visit.  Visit Diagnosis:  Neck pain  Neck stiffness      Subjective Assessment - 08/29/14 1347    Subjective Didn't like the manual traction.   Pain Score 5    Pain Location Neck   Pain Orientation Posterior   Pain Descriptors / Indicators Stabbing;Sharp   Pain Onset 1 to 4 weeks ago                         [facility] Adult PT Treatment/Exercise - 08/29/14 0001    Ultrasound   Ultrasound Location Bilateral UT's x 14 minutes   Ultrasound Parameters 1.50 W/CM2   Ultrasound Goals Pain   Manual Therapy   Manual Therapy Myofascial release   Myofascial Release STW and bilateral UT and suboccipital release technique x 24 minutes.                  PT Short Term Goals - 08/23/14 1758    PT SHORT TERM GOAL #1   Title Ind with HEP.   Time 2   Period Weeks   Status New           PT Long Term Goals - 08/23/14 1758    PT LONG TERM GOAL #1   Title Bil active cervical rotation= 65 degrees so she can turn her head more easily while driving.   Time 4   Period Weeks   Status New   PT LONG TERM GOAL #2   Title Eliminate headaches.   Time 4   Period Weeks   Status New   PT LONG TERM GOAL #3   Title Perform cervical movements with pain not > 3/10.   Time 4   Period Weeks   Status New   PT LONG TERM GOAL #4   Title Perform ADL's with pain not > 3/10.               Problem List There are no active problems to display for this patient.   Prentiss Polio, [place] MPT 08/29/2014, 1:54 PM  [facility] [address] [place], [place], [address] Phone: [phone]   Fax:  [phone]

## 2014-09-03 ENCOUNTER — Telehealth: Payer: Self-pay | Admitting: Family Medicine

## 2014-09-04 ENCOUNTER — Ambulatory Visit: Payer: 59 | Admitting: *Deleted

## 2014-09-04 DIAGNOSIS — M436 Torticollis: Secondary | ICD-10-CM

## 2014-09-04 DIAGNOSIS — M542 Cervicalgia: Secondary | ICD-10-CM | POA: Diagnosis not present

## 2014-09-04 NOTE — Therapy (Signed)
[facility] [place], [place], [address] Phone: [phone]   Fax:  [phone]  Physical Therapy Treatment  Patient Details  Name: Taylor Craig MRN: [id number] Date of Birth: [birth year] Referring Provider:  Florina Ou, MD  Encounter Date: 09/04/2014      PT End of Session - 09/04/14 1402    Visit Number 4   Date for PT Re-Evaluation 10/04/14   PT Start Time 0104   PT Stop Time 0155   PT Time Calculation (min) 51 min   Activity Tolerance Patient tolerated treatment well   Behavior During Therapy [facility] for tasks assessed/performed      Past Medical History  Diagnosis Date  . Hyperlipemia   . Cancer 2004    melanoma     Past Surgical History  Procedure Laterality Date  . Skin cancer excision    . Abdominal hysterectomy      There were no vitals filed for this visit.  Visit Diagnosis:  Neck pain  Neck stiffness      Subjective Assessment - 09/04/14 1306    Subjective Pain in upper cervical region, not as bad as it has been recently.   Limitations Lifting   Patient Stated Goals Get out of pain.   Currently in Pain? Yes   Pain Score 5    Pain Location Neck   Pain Orientation Posterior   Pain Descriptors / Indicators Aching   Pain Onset 1 to 4 weeks ago   Pain Frequency Constant   Aggravating Factors  lifting and extending neck   Pain Relieving Factors avoidance of lifting and cervical extension                         [facility] Adult PT Treatment/Exercise - 09/04/14 0001    Neck Exercises: Theraband   Scapula Retraction 10 reps;Red   Ultrasound   Ultrasound Location bilateral   Ultrasound Parameters 1.2 W/cm2 x 14 min   Ultrasound Goals Pain   Manual Therapy   Manual Therapy Joint mobilization;Myofascial release   Manual therapy comments STW to bilateral upper traps   Neck Exercises: Stretches   Corner Stretch 3 reps;20 seconds                PT Education - 09/04/14  1401    Education provided Yes   Education Details Pt. instructed to continue current home exercise program.   Person(s) Educated Patient   Methods Explanation   Comprehension Verbalized understanding          PT Short Term Goals - 08/23/14 1758    PT SHORT TERM GOAL #1   Title Ind with HEP.   Time 2   Period Weeks   Status New           PT Long Term Goals - 08/23/14 1758    PT LONG TERM GOAL #1   Title Bil active cervical rotation= 65 degrees so she can turn her head more easily while driving.   Time 4   Period Weeks   Status New   PT LONG TERM GOAL #2   Title Eliminate headaches.   Time 4   Period Weeks   Status New   PT LONG TERM GOAL #3   Title Perform cervical movements with pain not > 3/10.   Time 4   Period Weeks   Status New   PT LONG TERM GOAL #4   Title Perform ADL's with pain not > 3/10.  Plan - 09/04/14 1403    Clinical Impression Statement Pt. was able to perform 2 new exercises today without significant difficulty.  No complaints with manual therapy or modalities.   Pt will benefit from skilled therapeutic intervention in order to improve on the following deficits Pain;Decreased range of motion   Rehab Potential Good   PT Frequency 3x / week   PT Duration 4 weeks   PT Treatment/Interventions Electrical Stimulation;Ultrasound;Moist Heat;Therapeutic exercise;Manual techniques;Passive range of motion;Traction   PT Next Visit Plan Continue manual therapy techniques; gradually progress exercises-check response to today's treatment.   Consulted and Agree with Plan of Care Patient        Problem List There are no active problems to display for this patient.   Cherlyn Cushing 09/04/2014, 2:11 PM  [facility] [address] [place], [place], [address] Phone: [phone]   Fax:  [phone]

## 2014-09-06 ENCOUNTER — Ambulatory Visit: Payer: 59 | Admitting: Physical Therapy

## 2014-09-06 DIAGNOSIS — M542 Cervicalgia: Secondary | ICD-10-CM | POA: Diagnosis not present

## 2014-09-06 DIAGNOSIS — M436 Torticollis: Secondary | ICD-10-CM

## 2014-09-06 NOTE — Therapy (Signed)
[facility] [place], [place], [address] Phone: [phone]   Fax:  [phone]  Physical Therapy Treatment  Patient Details  Name: Taylor Craig MRN: [id number] Date of Birth: Aug 30, 1955 Referring Provider:  Florina Ou, MD  Encounter Date: 09/06/2014      PT End of Session - 09/06/14 1303    Visit Number 5   Number of Visits 12   Date for PT Re-Evaluation 10/04/14   PT Start Time 1884   PT Stop Time 1660   PT Time Calculation (min) 47 min      Past Medical History  Diagnosis Date  . Hyperlipemia   . Cancer 2004    melanoma     Past Surgical History  Procedure Laterality Date  . Skin cancer excision    . Abdominal hysterectomy      There were no vitals filed for this visit.  Visit Diagnosis:  Neck pain  Neck stiffness      Subjective Assessment - 09/06/14 1303    Subjective "Feels alright." Reports pain with corner stretch and has to complete chin tuck with extension slowly due to head instabiltiy sensation.   Limitations Lifting   Patient Stated Goals Get out of pain.   Currently in Pain? Yes   Pain Score 3    Pain Location Neck   Pain Orientation Posterior;Left;Right   Pain Descriptors / Indicators Aching   Pain Onset 1 to 4 weeks ago   Pain Frequency Constant            [facility] PT Assessment - 09/06/14 0001    Assessment   Medical Diagnosis Spondylosis without myleopathy, cervical region.                     [facility] Adult PT Treatment/Exercise - 09/06/14 0001    Neck Exercises: Standing   Neck Retraction 20 reps;Other (comment)  Chin tuck with extension x20 reps slowly   Other Standing Exercises B scapular retraction red theraband x20 reps   Other Standing Exercises B shoulder extension red theraband x20 reps   Modalities   Modalities Ultrasound   Ultrasound   Ultrasound Location B Upper Trap   Ultrasound Parameters 1.2 w/cm2, 100%, 1 mhz   Ultrasound Goals Pain   Manual Therapy   Manual Therapy Myofascial release   Myofascial Release STW/ TPR to B Upper Trap/ cervical paraspinals   Neck Exercises: Stretches   Upper Trapezius Stretch 3 reps;30 seconds  L UT                  PT Short Term Goals - 08/23/14 1758    PT SHORT TERM GOAL #1   Title Ind with HEP.   Time 2   Period Weeks   Status New           PT Long Term Goals - 08/23/14 1758    PT LONG TERM GOAL #1   Title Bil active cervical rotation= 65 degrees so she can turn her head more easily while driving.   Time 4   Period Weeks   Status New   PT LONG TERM GOAL #2   Title Eliminate headaches.   Time 4   Period Weeks   Status New   PT LONG TERM GOAL #3   Title Perform cervical movements with pain not > 3/10.   Time 4   Period Weeks   Status New   PT LONG TERM GOAL #4   Title Perform ADL's with  pain not > 3/10.               Plan - 09/06/14 1342    Clinical Impression Statement Patient tolerated treatment fairly well today with complaints of tenderness over B Upper Trapezius. L Upper Trapezius appeared to have increased inflammation than the R side although the whole region around C7 protuberance appeared to have increased inflammation. Tolerated exercises fairly well although she had increased pain during scapular retraction with red theraband and completed chin tuck with extension slowly due to symptoms of head instability. Normal modalities response noted following removal of the modalities. Cervical rotation was slightly diminished when rotating to the L side. Experienced 3/10 pain following treatment.   Pt will benefit from skilled therapeutic intervention in order to improve on the following deficits Pain;Decreased range of motion   Rehab Potential Good   PT Frequency 3x / week   PT Duration 4 weeks   PT Treatment/Interventions Electrical Stimulation;Ultrasound;Moist Heat;Therapeutic exercise;Manual techniques;Passive range of motion;Traction   PT Next  Visit Plan Continue manual therapy techniques; gradually progress exercises-check response to today's treatment.   Consulted and Agree with Plan of Care Patient        Problem List There are no active problems to display for this patient.   Wynelle Fanny, PTA 09/06/2014, 1:52 PM  [facility] [address] [place], [place], [address] Phone: [phone]   Fax:  [phone]

## 2014-09-11 ENCOUNTER — Encounter: Payer: Self-pay | Admitting: Physical Therapy

## 2014-09-11 ENCOUNTER — Ambulatory Visit: Payer: 59 | Admitting: Physical Therapy

## 2014-09-11 DIAGNOSIS — M542 Cervicalgia: Secondary | ICD-10-CM

## 2014-09-11 DIAGNOSIS — M436 Torticollis: Secondary | ICD-10-CM

## 2014-09-11 NOTE — Therapy (Signed)
[facility] [place], [place], [address] Phone: [phone]   Fax:  [phone]  Physical Therapy Treatment  Patient Details  Name: Taylor Craig MRN: [id number] Date of Birth: 03/16/1955 Referring Provider:  Florina Ou, MD  Encounter Date: 09/11/2014      PT End of Session - 09/11/14 1303    Visit Number 6   Number of Visits 12   Date for PT Re-Evaluation 10/04/14   PT Start Time 1300   PT Stop Time 1336   PT Time Calculation (min) 36 min      Past Medical History  Diagnosis Date  . Hyperlipemia   . Cancer 2004    melanoma     Past Surgical History  Procedure Laterality Date  . Skin cancer excision    . Abdominal hysterectomy      There were no vitals filed for this visit.  Visit Diagnosis:  Neck pain  Neck stiffness      Subjective Assessment - 09/11/14 1302    Subjective Reports increased for no known reason today. Trying to get a headache.   Limitations Lifting   Patient Stated Goals Get out of pain.   Currently in Pain? Yes   Pain Score 6    Pain Location Neck   Pain Orientation Right;Left;Posterior   Pain Descriptors / Indicators Aching   Pain Onset 1 to 4 weeks ago   Pain Frequency Constant            [facility] PT Assessment - 09/11/14 0001    Assessment   Medical Diagnosis Spondylosis without myleopathy, cervical region.                     [facility] Adult PT Treatment/Exercise - 09/11/14 0001    Modalities   Modalities Ultrasound   Ultrasound   Ultrasound Location B Upper Trap   Ultrasound Parameters 1.5 w/cm2, 100%, 1 mhz   Ultrasound Goals Pain   Manual Therapy   Manual Therapy Myofascial release   Myofascial Release MFR/TPR to B Upper Trapezius/ cervical paraspinals and suboccipital release to decrease pain and tightness                  PT Short Term Goals - 08/23/14 1758    PT SHORT TERM GOAL #1   Title Ind with HEP.   Time 2   Period Weeks    Status New           PT Long Term Goals - 08/23/14 1758    PT LONG TERM GOAL #1   Title Bil active cervical rotation= 65 degrees so she can turn her head more easily while driving.   Time 4   Period Weeks   Status New   PT LONG TERM GOAL #2   Title Eliminate headaches.   Time 4   Period Weeks   Status New   PT LONG TERM GOAL #3   Title Perform cervical movements with pain not > 3/10.   Time 4   Period Weeks   Status New   PT LONG TERM GOAL #4   Title Perform ADL's with pain not > 3/10.               Plan - 09/11/14 1339    Clinical Impression Statement Patient tolerated treatment well today with complaints of tenderness in the R Upper Trapezius and suboccipital pain. L Upper Trapezius did not appear to have increased swelling visibly present today.  Normal [place]  response noted following removal of the [place]. Erythemia present following manual therapy to B Upper Trapezius and cervical paraspinals. Suboccipital release performed following reports of increased pain and was notably tight in cervical musculature. Experienced 3/10 pain following treatment and decreased tension in the suboccipital region.   Pt will benefit from skilled therapeutic intervention in order to improve on the following deficits Pain;Decreased range of motion   Rehab Potential Good   PT Frequency 3x / week   PT Duration 4 weeks   PT Treatment/Interventions Electrical Stimulation;Ultrasound;Moist Heat;Therapeutic exercise;Manual techniques;Passive range of motion;Traction   PT Next Visit Plan Continue manual therapy techniques; gradually progress exercises-check response to today's treatment.   Consulted and Agree with Plan of Care Patient        Problem List There are no active problems to display for this patient.   Wynelle Fanny, PTA 09/11/2014, 1:45 PM  [facility] [address] [place], [place], [address] Phone: [phone]   Fax:   [phone]

## 2014-09-13 ENCOUNTER — Encounter: Payer: Self-pay | Admitting: Physical Therapy

## 2014-09-13 ENCOUNTER — Ambulatory Visit: Payer: 59 | Admitting: Physical Therapy

## 2014-09-13 DIAGNOSIS — M542 Cervicalgia: Secondary | ICD-10-CM | POA: Diagnosis not present

## 2014-09-13 DIAGNOSIS — M436 Torticollis: Secondary | ICD-10-CM

## 2014-09-13 NOTE — Therapy (Signed)
[facility] [place], [place], [address] Phone: [phone]   Fax:  [phone]  Physical Therapy Treatment  Patient Details  Name: STARLA DELLER MRN: [id number] Date of Birth: [birth year] Referring Provider:  Florina Ou, MD  Encounter Date: 09/13/2014      PT End of Session - 09/13/14 1302    Visit Number 7   Number of Visits 12   Date for PT Re-Evaluation 10/04/14   PT Start Time 1301   PT Stop Time 1342   PT Time Calculation (min) 41 min   Activity Tolerance Patient tolerated treatment well   Behavior During Therapy [facility] for tasks assessed/performed      Past Medical History  Diagnosis Date  . Hyperlipemia   . Cancer 2004    melanoma     Past Surgical History  Procedure Laterality Date  . Skin cancer excision    . Abdominal hysterectomy      There were no vitals filed for this visit.  Visit Diagnosis:  Neck pain  Neck stiffness      Subjective Assessment - 09/13/14 1301    Subjective Reports feeling better than she did Tuesday with no headache.   Limitations Lifting   Patient Stated Goals Get out of pain.   Currently in Pain? Yes   Pain Score 3    Pain Location Neck   Pain Orientation Left   Pain Descriptors / Indicators Aching   Pain Onset 1 to 4 weeks ago                         [facility] Adult PT Treatment/Exercise - 09/13/14 0001    Neck Exercises: Seated   Neck Retraction 20 reps;Other (comment)  chin tuck with cervical extension   Modalities   Modalities Ultrasound   Ultrasound   Ultrasound Location L Upper Trap   Ultrasound Parameters 1.5 w/cm2, 100%, 1 mhz   Ultrasound Goals Pain   Manual Therapy   Manual Therapy Myofascial release   Myofascial Release MFR/TPR to L Upper Ttap/ Levator Scapulae to decrease tightness and pain   Neck Exercises: Stretches   Upper Trapezius Stretch 3 reps;30 seconds  B Traps   Levator Stretch 3 reps;30 seconds  B Levator   Lower  Cervical/Upper Thoracic Stretch 3 reps;30 seconds                  PT Short Term Goals - 08/23/14 1758    PT SHORT TERM GOAL #1   Title Ind with HEP.   Time 2   Period Weeks   Status New           PT Long Term Goals - 08/23/14 1758    PT LONG TERM GOAL #1   Title Bil active cervical rotation= 65 degrees so she can turn her head more easily while driving.   Time 4   Period Weeks   Status New   PT LONG TERM GOAL #2   Title Eliminate headaches.   Time 4   Period Weeks   Status New   PT LONG TERM GOAL #3   Title Perform cervical movements with pain not > 3/10.   Time 4   Period Weeks   Status New   PT LONG TERM GOAL #4   Title Perform ADL's with pain not > 3/10.               Plan - 09/13/14 1351    Clinical Impression  Statement Patient tolerated treatment well today with complaints of tenderness over the L Upper Trap/ Levator Scapula region. L Upper Trapezius was slightly more swollen than the R Upper Trapezius today. Notable tightness noted in L Upper Trapezius and Levator Scapula during manual therapy in the area that the patient reported tenderness. Moderate pressure tolerated by patient during manual therapy. Normal modalities response noted following removal of the modalities. Completed exercises and stretches well with verbal cueing and minimal demonstration. Denied pain following treatment.   Pt will benefit from skilled therapeutic intervention in order to improve on the following deficits Pain;Decreased range of motion   Rehab Potential Good   PT Frequency 3x / week   PT Duration 4 weeks   PT Treatment/Interventions Electrical Stimulation;Ultrasound;Moist Heat;Therapeutic exercise;Manual techniques;Passive range of motion;Traction   PT Next Visit Plan Continue manual therapy techniques; gradually progress exercises-check response to today's treatment.   Consulted and Agree with Plan of Care Patient        Problem List There are no active  problems to display for this patient.   Wynelle Fanny, PTA 09/13/2014, 1:58 PM  [facility] [address] [place], [place], [address] Phone: [phone]   Fax:  [phone]

## 2014-09-17 ENCOUNTER — Ambulatory Visit: Payer: 59 | Admitting: Physical Therapy

## 2014-09-17 ENCOUNTER — Encounter: Payer: Self-pay | Admitting: Physical Therapy

## 2014-09-17 DIAGNOSIS — M542 Cervicalgia: Secondary | ICD-10-CM

## 2014-09-17 DIAGNOSIS — M436 Torticollis: Secondary | ICD-10-CM

## 2014-09-17 NOTE — Therapy (Signed)
[facility] [place], [place], [address] Phone: [phone]   Fax:  [phone]  Physical Therapy Treatment  Patient Details  Name: Taylor Craig MRN: [id number] Date of Birth: April 23, 1955 Referring Provider:  Florina Ou, MD  Encounter Date: 09/17/2014      PT End of Session - 09/17/14 1341    Visit Number 8   Number of Visits 12   Date for PT Re-Evaluation 10/04/14   PT Start Time 1301   PT Stop Time 1340   PT Time Calculation (min) 39 min   Activity Tolerance Patient tolerated treatment well   Behavior During Therapy [facility] for tasks assessed/performed      Past Medical History  Diagnosis Date  . Hyperlipemia   . Cancer 2004    melanoma     Past Surgical History  Procedure Laterality Date  . Skin cancer excision    . Abdominal hysterectomy      There were no vitals filed for this visit.  Visit Diagnosis:  Neck pain  Neck stiffness      Subjective Assessment - 09/17/14 1341    Subjective Reports "a little headache." Reports completing chin to chest exercise   Limitations Lifting   Patient Stated Goals Get out of pain.   Currently in Pain? Yes   Pain Score 4    Pain Location Neck   Pain Descriptors / Indicators Tingling   Pain Onset 1 to 4 weeks ago            [facility] PT Assessment - 09/17/14 0001    Assessment   Medical Diagnosis Spondylosis without myleopathy, cervical region.                     [facility] Adult PT Treatment/Exercise - 09/17/14 0001    Modalities   Modalities Ultrasound   Ultrasound   Ultrasound Location B Upper Trapezius   Ultrasound Parameters 1.5 w/cm2, 100%, 1 mhz   Ultrasound Goals Pain   Manual Therapy   Manual Therapy Myofascial release   Myofascial Release MFR/TPR to L Upper Trap/ Levator Scapulae/ scapular retractors and suboccipital release to decrease tightness and pain   Neck Exercises: Stretches   Upper Trapezius Stretch 3 reps;30 seconds   Bilateral   Levator Stretch 3 reps;30 seconds  Bilateral   Lower Cervical/Upper Thoracic Stretch 3 reps;30 seconds                  PT Short Term Goals - 08/23/14 1758    PT SHORT TERM GOAL #1   Title Ind with HEP.   Time 2   Period Weeks   Status New           PT Long Term Goals - 08/23/14 1758    PT LONG TERM GOAL #1   Title Bil active cervical rotation= 65 degrees so she can turn her head more easily while driving.   Time 4   Period Weeks   Status New   PT LONG TERM GOAL #2   Title Eliminate headaches.   Time 4   Period Weeks   Status New   PT LONG TERM GOAL #3   Title Perform cervical movements with pain not > 3/10.   Time 4   Period Weeks   Status New   PT LONG TERM GOAL #4   Title Perform ADL's with pain not > 3/10.               Plan - 09/17/14  1343    Clinical Impression Statement Patient tolerated treatment well today without complaints of tenderness or pain duirng manual therapy. Notable tightness in B medial Upper Trapezius around C7 protuberance, into scapular retractors and into cervical paraspinals. Moderate pressure tolerated by patient today. Had no complaints during suboccipital release or cervical stretches that followed manual therapy. Normal modalities response noted following removal of the modalities. Experienced 3/10 pain following treatment.   Pt will benefit from skilled therapeutic intervention in order to improve on the following deficits Pain;Decreased range of motion   Rehab Potential Good   PT Frequency 3x / week   PT Duration 4 weeks   PT Treatment/Interventions Electrical Stimulation;Ultrasound;Moist Heat;Therapeutic exercise;Manual techniques;Passive range of motion;Traction   PT Next Visit Plan Continue manual therapy techniques; gradually progress exercises-check response to today's treatment.   Consulted and Agree with Plan of Care Patient        Problem List There are no active problems to display for this  patient.   Wynelle Fanny, PTA 09/17/2014, 1:47 PM  [facility] [address] [place], [place], [address] Phone: [phone]   Fax:  [phone]

## 2014-09-19 ENCOUNTER — Encounter: Payer: 59 | Admitting: Physical Therapy

## 2014-09-24 ENCOUNTER — Ambulatory Visit: Payer: 59 | Admitting: Physical Therapy

## 2014-09-24 ENCOUNTER — Encounter: Payer: Self-pay | Admitting: Physical Therapy

## 2014-09-24 DIAGNOSIS — M436 Torticollis: Secondary | ICD-10-CM

## 2014-09-24 DIAGNOSIS — M542 Cervicalgia: Secondary | ICD-10-CM | POA: Diagnosis not present

## 2014-09-24 NOTE — Therapy (Signed)
[facility] [place], [place], [address] Phone: [phone]   Fax:  [phone]  Physical Therapy Treatment  Patient Details  Name: CRISTINA CENICEROS MRN: [id number] Date of Birth: 23-Mar-1955 Referring Provider:  Florina Ou, MD  Encounter Date: 09/24/2014      PT End of Session - 09/24/14 1301    Visit Number 9   Number of Visits 12   Date for PT Re-Evaluation 10/04/14   PT Start Time 1300   PT Stop Time 1343   PT Time Calculation (min) 43 min   Activity Tolerance Patient tolerated treatment well;Patient limited by pain   Behavior During Therapy [facility] for tasks assessed/performed      Past Medical History  Diagnosis Date  . Hyperlipemia   . Cancer 2004    melanoma     Past Surgical History  Procedure Laterality Date  . Skin cancer excision    . Abdominal hysterectomy      There were no vitals filed for this visit.  Visit Diagnosis:  Neck pain  Neck stiffness      Subjective Assessment - 09/24/14 1300    Subjective Reports neck was feeling okay until she vacuumed floors. Reports that MD started her on anti-depressant for nerve damage and the next day after 1 pill she felt like she wanted to cry, not get out of bed and has not taken a dose since. Patient reports she called she called MD office to make MD aware.   Limitations Lifting   Patient Stated Goals Get out of pain.   Currently in Pain? Yes   Pain Score 3    Pain Location Neck   Pain Orientation Left   Pain Descriptors / Indicators Aching   Pain Onset 1 to 4 weeks ago            [facility] PT Assessment - 09/24/14 0001    Assessment   Medical Diagnosis Spondylosis without myleopathy, cervical region.   ROM / Strength   AROM / PROM / Strength AROM   AROM   AROM Assessment Site Cervical   Cervical - Right Rotation 62   Cervical - Left Rotation 55                     [facility] Adult PT Treatment/Exercise - 09/24/14 0001    Neck  Exercises: Seated   Neck Retraction 20 reps;Other (comment)  with cervical extension   W Back Limitations x16 reps; reported pain in cervical spine region   Modalities   Modalities Ultrasound   Ultrasound   Ultrasound Location L Upper Trap   Ultrasound Parameters 1.5 w/cm2, 100%, 1 mhz   Ultrasound Goals Pain   Manual Therapy   Manual Therapy Myofascial release   Myofascial Release MFR/TPR to L Upper Trap/ Levator Scapulae to decrease tightness and pain   Neck Exercises: Stretches   Upper Trapezius Stretch 3 reps;30 seconds  L UT   Levator Stretch 3 reps;30 seconds  L Levator Scapula                  PT Short Term Goals - 08/23/14 1758    PT SHORT TERM GOAL #1   Title Ind with HEP.   Time 2   Period Weeks   Status New           PT Long Term Goals - 09/24/14 1338    PT LONG TERM GOAL #1   Title Bil active cervical rotation= 65 degrees so  she can turn her head more easily while driving.   Time 4   Period Weeks   Status On-going   PT LONG TERM GOAL #2   Title Eliminate headaches.   Time 4   Period Weeks   Status On-going   PT LONG TERM GOAL #3   Title Perform cervical movements with pain not > 3/10.   Time 4   Period Weeks   Status Achieved   PT LONG TERM GOAL #4   Title Perform ADL's with pain not > 3/10.   Status On-going               Plan - 09/24/14 1430    Clinical Impression Statement Patient tolerated treatment well today without complaints of tenderness or pain during manual therapy. Had minimal to slightly moderate tightness in L Upper Trap region today. Continues to complete stretches and exercises well with only minimal multimodal cueing for technique. Reported increased cervical pain during W back exercise and exercise was terminated. Normal modalities response noted following removal of the modalities. Experienced 3/10 pain following treatment.   Pt will benefit from skilled therapeutic intervention in order to improve on the  following deficits Pain;Decreased range of motion   Rehab Potential Good   PT Frequency 3x / week   PT Duration 4 weeks   PT Treatment/Interventions Electrical Stimulation;Ultrasound;Moist Heat;Therapeutic exercise;Manual techniques;Passive range of motion;Traction   PT Next Visit Plan Continue manual therapy techniques; gradually progress exercises-check response to today's treatment.   Consulted and Agree with Plan of Care Patient        Problem List There are no active problems to display for this patient.   Wynelle Fanny, PTA 09/24/2014, 2:39 PM  [facility] [address] [place], [place], [address] Phone: [phone]   Fax:  [phone]

## 2014-09-25 ENCOUNTER — Telehealth: Payer: Self-pay | Admitting: Pediatrics

## 2014-09-25 ENCOUNTER — Encounter: Payer: Self-pay | Admitting: Pediatrics

## 2014-09-25 ENCOUNTER — Ambulatory Visit (INDEPENDENT_AMBULATORY_CARE_PROVIDER_SITE_OTHER): Payer: 59 | Admitting: Pediatrics

## 2014-09-25 VITALS — BP 137/85 | HR 95 | Temp 97.8°F | Ht 61.0 in | Wt 159.8 lb

## 2014-09-25 DIAGNOSIS — E785 Hyperlipidemia, unspecified: Secondary | ICD-10-CM | POA: Diagnosis not present

## 2014-09-25 DIAGNOSIS — M47812 Spondylosis without myelopathy or radiculopathy, cervical region: Secondary | ICD-10-CM | POA: Diagnosis not present

## 2014-09-25 DIAGNOSIS — Z6830 Body mass index (BMI) 30.0-30.9, adult: Secondary | ICD-10-CM | POA: Diagnosis not present

## 2014-09-25 NOTE — Patient Instructions (Signed)
--   No food after supper -- walking 5 days a week is the goal, ok to work up to it. 20-30 minutes. -- Home PT exercises every day -- Get better!!!!!

## 2014-09-25 NOTE — Assessment & Plan Note (Signed)
Last lipid panel 06/2014: total chol: 166, TG 147, HDL 57, LDL 86. On atorvastatin 80 mg.

## 2014-09-25 NOTE — Progress Notes (Signed)
Subjective:    Patient ID: Taylor Craig, female    DOB: 1955/04/22, 59 y.o.   MRN: [id number]  HPI: Taylor Craig is a 59 y.o. female presenting on 09/25/2014 for New Patient (Initial Visit)  Neck: degenerative arthitis. Has had injections for sever arthritis and injections. Started having symptoms about 3 months ago after awkwardly rolling over in bed. Last seen on Thursday by spine and scoliosis specialist in [place]. Started on amitriptyline for nerve pain but took one pill and it made her so sleepy she stopped. Doing regular physical therpay that she thinks is helping some as well. Some improvement over the last three months. Activity tends to set it off. Neck tends to be stiff in the morning. Taking meloxicam 7.5. Takes flexeril at night. Pain worst when looking up, avoids doing it.  H/o hyperlipidemia. Takes atorvastatin daily 80mg . Has had slightly elevated LFTs in the past (AST/ALT high but <100). Most recent check normal.    Relevant past medical, surgical, family and social history reviewed and updated as indicated. Interim medical history since our last visit reviewed. Allergies and medications reviewed and updated.  Review of Systems  Constitutional: Negative for fever and chills.  Eyes: Negative for blurred vision and double vision.  Respiratory: Negative for cough.   Cardiovascular: Negative for chest pain and palpitations.  Musculoskeletal: Positive for back pain and neck pain. Negative for falls.  Skin: Negative for itching and rash.    ROS: Per HPI unless specifically indicated above   Current Outpatient Prescriptions  Medication Sig Dispense Refill  . atorvastatin (LIPITOR) 80 MG tablet Take 80 mg by mouth daily.    . cyclobenzaprine (FLEXERIL) 5 MG tablet     . meloxicam (MOBIC) 7.5 MG tablet     . Multiple Vitamin (MULTIVITAMIN WITH MINERALS) TABS tablet Take 1 tablet by mouth daily.     No current facility-administered medications for this visit.        Objective:    BP 137/85 mmHg  Pulse 95  Temp(Src) 97.8 F (36.6 C) (Oral)  Ht 5\' 1"  (1.549 m)  Wt 159 lb 12.8 oz (72.485 kg)  BMI 30.21 kg/m2  Wt Readings from Last 3 Encounters:  09/25/14 159 lb 12.8 oz (72.485 kg)  07/16/14 150 lb (68.04 kg)  07/16/14 150 lb (68.04 kg)     Gen: NAD, alert, cooperative with exam, NCAT EYES: EOMI, no scleral injection or icterus ENT:  OP without erythema LYMPH: no cervical LAD NECK: no thyroid nodules CV: NRRR, normal S1/S2, no murmur, DP pulses 2+ b/l Resp: CTABL, no wheezes, normal WOB Abd: +BS, soft, NTND. no guarding or organomegaly Ext: No edema, warm Neuro: Alert and oriented, strength equal b/l UE and LE, coodrination grossly normal MSK: normal muscle bulk, hesitant with turning neck side to side.      Assessment & Plan:   Laneta was seen today for new patient (initial visit).   Diagnoses and all orders for this visit:  Hyperlipidemia Last lipid panel 06/2014: total chol: 166, TG 147, HDL 57, LDL 86. On atorvastatin 80 mg.  Osteoarthritis of neck Followed by [facility] of [place]. In physical therapy, trying different modalities with them for pain and symptoms control.   BMI 30.0-30.9,adult Has gained 9 lbs over the summer since being unable to work due to neck pain. Discussed portion sizes, increasing fruits and vegetables, walking 20-30 minutes every day, avoiding snacking.  Last CMP 06/2014: 1359/4.7/104/26/13/88<0.77 Ca 10.7 Tbili: 0.5 AST: 28 ALT:  32 Alkphos: 67   Follow up plan: Return in about 6 months (around 03/26/2015).  Assunta Found, MD [facility] 09/25/2014, 6:26 PM

## 2014-09-26 ENCOUNTER — Ambulatory Visit: Payer: 59 | Admitting: Physical Therapy

## 2014-09-26 DIAGNOSIS — M436 Torticollis: Secondary | ICD-10-CM

## 2014-09-26 DIAGNOSIS — M542 Cervicalgia: Secondary | ICD-10-CM

## 2014-09-26 NOTE — Patient Instructions (Signed)
Prone on elbows. Look from left elbow to over right shoulder 10x. Look from right elbow to over left shoulder 10x.   Do 2-3 x/day.  Assess range of motion before and after.  Madelyn Flavors, PT 09/26/2014 1:53 PM [facility] [place], [place], [address] Phone: [phone]   Fax:  [phone]

## 2014-09-26 NOTE — Therapy (Signed)
[facility] [place], [place], [address] Phone: [phone]   Fax:  [phone]  Physical Therapy Treatment  Patient Details  Name: Taylor Craig MRN: [id number] Date of Birth: August 22, 1955 Referring Provider:  Florina Ou, MD  Encounter Date: 09/26/2014      PT End of Session - 09/26/14 1301    Visit Number 10   Number of Visits 12   Date for PT Re-Evaluation 10/04/14   PT Start Time 1301   PT Stop Time 1345   PT Time Calculation (min) 44 min      Past Medical History  Diagnosis Date  . Hyperlipemia   . Cancer 2004    melanoma     Past Surgical History  Procedure Laterality Date  . Skin cancer excision    . Abdominal hysterectomy      There were no vitals filed for this visit.  Visit Diagnosis:  Neck pain  Neck stiffness      Subjective Assessment - 09/26/14 1302    Subjective Not too bad today. I'd give it a 3/10. Patient feelling better since off her Anti Depressant. He added gabapentin today.                         [facility] Adult PT Treatment/Exercise - 09/26/14 0001    Self-Care   Self-Care Other Self-Care Comments   Other Self-Care Comments  use of towel roll in pillow to support neck   Neck Exercises: Seated   Neck Retraction 10 reps;5 secs   Other Seated Exercise scapular retraction x 10  Held Ws because they are causing increased pain   Neck Exercises: Supine   Neck Retraction 10 reps;5 secs   Neck Retraction Limitations pain begins at 8   Other Supine Exercise shoulder presses 3 sec hold x 10   Modalities   Modalities Ultrasound   Ultrasound   Ultrasound Location Bil UT   Ultrasound Parameters 1.5 wcm2 1 mhz cont x 10 min   Ultrasound Goals Pain   Manual Therapy   Manual Therapy Joint mobilization;Myofascial release;Manual Traction   Joint Mobilization grade I/II UPA mobs to bil C2-C7   Myofascial Release to bil suboccipitals MFR/TPR;OA release   Manual Traction  gentle in supine                PT Education - 09/26/14 1353    Education provided Yes   Education Details HEP: cervical diagonals in POE; modify scap retraction; do cervical retraction supine; self care: towel roll in pillow   Person(s) Educated Patient   Methods Explanation;Demonstration;Handout   Comprehension Verbalized understanding;Returned demonstration          PT Short Term Goals - 09/26/14 1356    PT SHORT TERM GOAL #1   Title Ind with HEP.   Time 2   Period Weeks   Status Achieved           PT Long Term Goals - 09/24/14 1338    PT LONG TERM GOAL #1   Title Bil active cervical rotation= 65 degrees so she can turn her head more easily while driving.   Time 4   Period Weeks   Status On-going   PT LONG TERM GOAL #2   Title Eliminate headaches.   Time 4   Period Weeks   Status On-going   PT LONG TERM GOAL #3   Title Perform cervical movements with pain not > 3/10.   Time 4  Period Weeks   Status Achieved   PT LONG TERM GOAL #4   Title Perform ADL's with pain not > 3/10.   Status On-going               Plan - 09/26/14 1354    Clinical Impression Statement Patient tolerated manual treatment well today with no increase in pain or headache. She is nervous with manual therapy. She has marked stiffness with lateral glides of cspine. Responded well to grade I/II mobs. No LTGs met yet but they are progressing. Patient is two days without headache.         Problem List Patient Active Problem List   Diagnosis Date Noted  . Osteoarthritis of neck 09/25/2014  . BMI 30.0-30.9,adult 09/25/2014  . Hyperlipidemia 09/25/2014    Madelyn Flavors PT  09/26/2014, 1:58 PM  [facility] [address] [place], [place], [address] Phone: [phone]   Fax:  [phone]

## 2014-10-03 ENCOUNTER — Encounter: Payer: Self-pay | Admitting: Physical Therapy

## 2014-10-03 ENCOUNTER — Ambulatory Visit: Payer: 59 | Attending: Rehabilitation | Admitting: Physical Therapy

## 2014-10-03 DIAGNOSIS — M542 Cervicalgia: Secondary | ICD-10-CM

## 2014-10-03 DIAGNOSIS — M436 Torticollis: Secondary | ICD-10-CM | POA: Insufficient documentation

## 2014-10-03 NOTE — Therapy (Signed)
[facility] [place], [place], [address] Phone: [phone]   Fax:  [phone]  Physical Therapy Treatment  Patient Details  Name: Taylor Craig MRN: [id number] Date of Birth: December 25, 1955 Referring Provider:  Eustaquio Maize, MD  Encounter Date: 10/03/2014      PT End of Session - 10/03/14 1400    Visit Number 11   Number of Visits 12   Date for PT Re-Evaluation 10/04/14   PT Start Time 1300   PT Stop Time 1348   PT Time Calculation (min) 48 min   Activity Tolerance Patient tolerated treatment well   Behavior During Therapy [facility] for tasks assessed/performed      Past Medical History  Diagnosis Date  . Hyperlipemia   . Cancer 2004    melanoma     Past Surgical History  Procedure Laterality Date  . Skin cancer excision    . Abdominal hysterectomy      There were no vitals filed for this visit.  Visit Diagnosis:  Neck pain  Neck stiffness      Subjective Assessment - 10/03/14 1351    Subjective Reported increased pain following previous treatment with increased pressure during manual therapy. Feels about the same on Gabapentin although in the morning she feels groggy. States she has not done prone exercise in which she was to look from shoulder to elbow secondary to not being able to tolerate prone laying.   Limitations Lifting   Patient Stated Goals Get out of pain.   Currently in Pain? Yes   Pain Score 2    Pain Location Neck   Pain Orientation Left   Pain Onset 1 to 4 weeks ago            [facility] PT Assessment - 10/03/14 0001    Assessment   Medical Diagnosis Spondylosis without myleopathy, cervical region.                     [facility] Adult PT Treatment/Exercise - 10/03/14 0001    Neck Exercises: Seated   Neck Retraction 20 reps;3 secs;Other (comment)   Neck Retraction Limitations chin tuck with cervical extension x15 reps   Other Seated Exercise B scapular retraction x20 reps   Other Seated Exercise B ipsilateral shoulder to contralateral elbow in sitting x10 reps each   Modalities   Modalities Ultrasound   Ultrasound   Ultrasound Location B Upper Trap   Ultrasound Parameters 1.5 w/cm2, 100%, 67mhz   Ultrasound Goals Pain   Manual Therapy   Manual Therapy Myofascial release   Myofascial Release MFR to B Upper Trap, scapular retractors, Levator Scapulae to decrease tightness and pain                  PT Short Term Goals - 09/26/14 1356    PT SHORT TERM GOAL #1   Title Ind with HEP.   Time 2   Period Weeks   Status Achieved           PT Long Term Goals - 09/24/14 1338    PT LONG TERM GOAL #1   Title Bil active cervical rotation= 65 degrees so she can turn her head more easily while driving.   Time 4   Period Weeks   Status On-going   PT LONG TERM GOAL #2   Title Eliminate headaches.   Time 4   Period Weeks   Status On-going   PT LONG TERM GOAL #3   Title Perform  cervical movements with pain not > 3/10.   Time 4   Period Weeks   Status Achieved   PT LONG TERM GOAL #4   Title Perform ADL's with pain not > 3/10.   Status On-going               Plan - 10/03/14 1400    Clinical Impression Statement Patient tolerated treatment well today and did not report any increased pain or soreness during today's treatment. Continues to have not tightness in B Upper Trap, scapular retractors, and Levator Scapula noted during today's manual therapy. Required multimodal cueing for proper technique of chin tucks today to look straight ahead and to tuck chin into neck and not to look down. Reported experiencing pull in cervcial paraspinals region during L shoulder to R elbow exercise in sitting. Normal [place] response noted following end of [place] session. Experienced 2/10 pain following treatment.   Pt will benefit from skilled therapeutic intervention in order to improve on the following deficits Pain;Decreased range of motion   Rehab Potential Good    PT Frequency 3x / week   PT Duration 4 weeks   PT Treatment/Interventions Electrical Stimulation;Ultrasound;Moist Heat;Therapeutic exercise;Manual techniques;Passive range of motion;Traction   PT Next Visit Plan Continue manual therapy techniques; gradually progress exercises-check response to today's treatment.   Consulted and Agree with Plan of Care Patient        Problem List Patient Active Problem List   Diagnosis Date Noted  . Osteoarthritis of neck 09/25/2014  . BMI 30.0-30.9,adult 09/25/2014  . Hyperlipidemia 09/25/2014    Wynelle Fanny, PTA 10/03/2014, 2:08 PM  [facility] [address] [place], [place], [address] Phone: [phone]   Fax:  [phone]

## 2014-10-10 ENCOUNTER — Ambulatory Visit: Payer: 59 | Admitting: Physical Therapy

## 2014-10-10 DIAGNOSIS — M542 Cervicalgia: Secondary | ICD-10-CM

## 2014-10-10 DIAGNOSIS — M436 Torticollis: Secondary | ICD-10-CM

## 2014-10-10 NOTE — Therapy (Signed)
[facility] [place], [place], [address] Phone: [phone]   Fax:  [phone]  Physical Therapy Treatment  Patient Details  Name: Taylor Craig MRN: [id number] Date of Birth: 1955/11/19 Referring Provider:  Eustaquio Maize, MD  Encounter Date: 10/10/2014      PT End of Session - 10/10/14 1345    Visit Number 12   Number of Visits 12   Date for PT Re-Evaluation 10/04/14   PT Start Time 6979   PT Stop Time 1344   PT Time Calculation (min) 48 min   Activity Tolerance Patient tolerated treatment well   Behavior During Therapy [facility] for tasks assessed/performed      Past Medical History  Diagnosis Date  . Hyperlipemia   . Cancer 2004    melanoma     Past Surgical History  Procedure Laterality Date  . Skin cancer excision    . Abdominal hysterectomy      There were no vitals filed for this visit.  Visit Diagnosis:  No diagnosis found.                                 PT Short Term Goals - 09/26/14 1356    PT SHORT TERM GOAL #1   Title Ind with HEP.   Time 2   Period Weeks   Status Achieved           PT Long Term Goals - 09/24/14 1338    PT LONG TERM GOAL #1   Title Bil active cervical rotation= 65 degrees so she can turn her head more easily while driving.   Time 4   Period Weeks   Status On-going   PT LONG TERM GOAL #2   Title Eliminate headaches.   Time 4   Period Weeks   Status On-going   PT LONG TERM GOAL #3   Title Perform cervical movements with pain not > 3/10.   Time 4   Period Weeks   Status Achieved   PT LONG TERM GOAL #4   Title Perform ADL's with pain not > 3/10.   Status On-going     Treatment:  U/S at 1.50 W/CM2 x 12 minutes to bilateral UT's f/b STW/M and suboccipital release technique x 26 minutes.  Patient tolerated treatment well.           Problem List Patient Active Problem List   Diagnosis Date Noted  . Osteoarthritis of neck  09/25/2014  . BMI 30.0-30.9,adult 09/25/2014  . Hyperlipidemia 09/25/2014   PHYSICAL THERAPY DISCHARGE SUMMARY  Visits from Start of Care: 12  Current functional level related to goals / functional outcomes: Please see above.   Remaining deficits: Continued neck pain and loss of motion.   Education / Equipment: HEP.  Plan: Patient agrees to discharge.  Patient goals were partially met. Patient is being discharged due to meeting the stated rehab goals.  ?????       [place], [place]  MPT  10/10/2014, 1:46 PM  [facility] [address] [place], [place], [address] Phone: [phone]   Fax:  [phone]

## 2014-11-05 ENCOUNTER — Ambulatory Visit: Payer: 59

## 2014-11-27 ENCOUNTER — Ambulatory Visit (INDEPENDENT_AMBULATORY_CARE_PROVIDER_SITE_OTHER): Payer: 59

## 2014-11-27 DIAGNOSIS — Z23 Encounter for immunization: Secondary | ICD-10-CM | POA: Diagnosis not present

## 2014-12-06 ENCOUNTER — Ambulatory Visit (INDEPENDENT_AMBULATORY_CARE_PROVIDER_SITE_OTHER): Payer: 59 | Admitting: Pediatrics

## 2014-12-06 ENCOUNTER — Encounter: Payer: Self-pay | Admitting: Pediatrics

## 2014-12-06 VITALS — BP 154/83 | HR 70 | Temp 98.3°F | Ht 61.0 in | Wt 163.8 lb

## 2014-12-06 DIAGNOSIS — H8110 Benign paroxysmal vertigo, unspecified ear: Secondary | ICD-10-CM | POA: Diagnosis not present

## 2014-12-06 DIAGNOSIS — R030 Elevated blood-pressure reading, without diagnosis of hypertension: Secondary | ICD-10-CM

## 2014-12-06 DIAGNOSIS — IMO0001 Reserved for inherently not codable concepts without codable children: Secondary | ICD-10-CM

## 2014-12-06 NOTE — Progress Notes (Signed)
    Subjective:    Patient ID: Taylor Craig, female    DOB: 26-Nov-1955, 59 y.o.   MRN: [id number]  CC: dizzy episodes  HPI: Taylor Craig is a 59 y.o. female presenting on 12/06/2014 for Dizziness  Lasts for seconds, happening since June off and on, once or twice a week Sometimes feels nauseated after it occurs Happening a couple times a week Notices mostly when she bends over and then stands up, sometimes when she turns her head fast She does get headaches when neck hurting a lot None recently Finished her PT for neck pain. No fevers, no URI symptoms No numbness, weakness, sensation changes  Relevant past medical, surgical, family and social history reviewed and updated as indicated. Interim medical history since our last visit reviewed. Allergies and medications reviewed and updated.   ROS: Per HPI unless specifically indicated above  Past Medical History Patient Active Problem List   Diagnosis Date Noted  . Osteoarthritis of neck 09/25/2014  . BMI 30.0-30.9,adult 09/25/2014  . Hyperlipidemia 09/25/2014    Current Outpatient Prescriptions  Medication Sig Dispense Refill  . atorvastatin (LIPITOR) 80 MG tablet Take 80 mg by mouth daily.    Marland Kitchen etodolac (LODINE) 400 MG tablet     . Multiple Vitamin (MULTIVITAMIN WITH MINERALS) TABS tablet Take 1 tablet by mouth daily.    Marland Kitchen tiZANidine (ZANAFLEX) 2 MG tablet      No current facility-administered medications for this visit.       Objective:    BP 154/83 mmHg  Pulse 70  Temp(Src) 98.3 F (36.8 C) (Oral)  Ht $R'5\' 1"'cy$  (1.549 m)  Wt 163 lb 12.8 oz (74.299 kg)  BMI 30.97 kg/m2  Wt Readings from Last 3 Encounters:  12/06/14 163 lb 12.8 oz (74.299 kg)  09/25/14 159 lb 12.8 oz (72.485 kg)  07/16/14 150 lb (68.04 kg)     Gen: NAD, alert, cooperative with exam, NCAT EYES: EOMI, no scleral injection or icterus ENT:  TMs pearly gray b/l, OP without erythema LYMPH: no cervical LAD CV: NRRR, normal S1/S2, no murmur,  distal pulses 2+ b/l Resp: CTABL, no wheezes, normal WOB Abd: +BS, soft, NTND. no guarding or organomegaly Ext: No edema, warm Neuro: Alert and oriented, strength equal b/l UE and LE, 2+ patellar reflexes b/l, cerebellar function intact, CNIII-XII intact, b/l tongue fasciculations present, a couple subtle beats nystagmus with R-ward gaze MSK: normal muscle bulk     Assessment & Plan:   Heba was seen today for episodic vertigo that lasts for seconds at a time a couple times a week, not consistently with same movements. Normal cerebellar function, likely peripheral vertigo. No other focal neurologic symptoms, though pt does have tongue fasciculations. Gave Epley maneuver handout. If vertigo continues will refer to PT for vestibular rehab. Happens so rarely and briefly meclizine unlikely to help. Will check labs.  Diagnoses and all orders for this visit:  Benign paroxysmal positional vertigo, unspecified laterality -     BMP8+EGFR -     CBC  Elevated blood pressure Pt says BP is controlled when not in doctor's office. Will check at home, bring in Bps to next visit. Not on BP meds now.   Follow up plan: 4 weeks  Assunta Found, MD [facility] 12/06/2014, 11:07 AM

## 2014-12-06 NOTE — Patient Instructions (Signed)
Epley Maneuver Self-Care  WHAT IS THE EPLEY MANEUVER?  The Epley maneuver is an exercise you can do to relieve symptoms of benign paroxysmal positional vertigo (BPPV). This condition is often just referred to as vertigo. BPPV is caused by the movement of tiny crystals (canaliths) inside your inner ear. The accumulation and movement of canaliths in your inner ear causes a sudden spinning sensation (vertigo) when you move your head to certain positions. Vertigo usually lasts about 30 seconds. BPPV usually occurs in just one ear. If you get vertigo when you lie on your left side, you probably have BPPV in your left ear. Your health care provider can tell you which ear is involved.   BPPV may be caused by a head injury. Many people older than 50 get BPPV for unknown reasons. If you have been diagnosed with BPPV, your health care provider may teach you how to do this maneuver. BPPV is not life threatening (benign) and usually goes away in time.   WHEN SHOULD I PERFORM THE EPLEY MANEUVER?  You can do this maneuver at home whenever you have symptoms of vertigo. You may do the Epley maneuver up to 3 times a day until your symptoms of vertigo go away.  HOW SHOULD I DO THE EPLEY MANEUVER?  1. Sit on the edge of a bed or table with your back straight. Your legs should be extended or hanging over the edge of the bed or table.    2. Turn your head halfway toward the affected ear.    3. Lie backward quickly with your head turned until you are lying flat on your back. You may want to position a pillow under your shoulders.    4. Hold this position for 30 seconds. You may experience an attack of vertigo. This is normal. Hold this position until the vertigo stops.  5. Then turn your head to the opposite direction until your unaffected ear is facing the floor.    6. Hold this position for 30 seconds. You may experience an attack of vertigo. This is normal. Hold this position until the vertigo stops.  7. Now turn your whole body to  the same side as your head. Hold for another 30 seconds.    8. You can then sit back up.  ARE THERE RISKS TO THIS MANEUVER?  In some cases, you may have other symptoms (such as changes in your vision, weakness, or numbness). If you have these symptoms, stop doing the maneuver and call your health care provider. Even if doing these maneuvers relieves your vertigo, you may still have dizziness. Dizziness is the sensation of light-headedness but without the sensation of movement. Even though the Epley maneuver may relieve your vertigo, it is possible that your symptoms will return within 5 years.  WHAT SHOULD I DO AFTER THIS MANEUVER?  After doing the Epley maneuver, you can return to your normal activities. Ask your doctor if there is anything you should do at home to prevent vertigo. This may include:  · Sleeping with two or more pillows to keep your head elevated.  · Not sleeping on the side of your affected ear.  · Getting up slowly from bed.  · Avoiding sudden movements during the day.  · Avoiding extreme head movement, like looking up or bending over.  · Wearing a cervical collar to prevent sudden head movements.  WHAT SHOULD I DO IF MY SYMPTOMS GET WORSE?  Call your health care provider if your vertigo gets worse. Call your provider right way if   you have other symptoms, including:   · Nausea.  · Vomiting.  · Headache.  · Weakness.  · Numbness.  · Vision changes.     This information is not intended to replace advice given to you by your health care provider. Make sure you discuss any questions you have with your health care provider.     Document Released: 01/17/2013 Document Reviewed: 01/17/2013  Elsevier Interactive Patient Education ©2016 [employer]

## 2014-12-07 LAB — BMP8+EGFR
BUN / CREAT RATIO: 13 (ref 11–26)
BUN: 12 mg/dL (ref 8–27)
CO2: 24 mmol/L (ref 18–29)
CREATININE: 0.93 mg/dL (ref 0.57–1.00)
Calcium: 10 mg/dL (ref 8.7–10.3)
Chloride: 101 mmol/L (ref 97–106)
GFR calc Af Amer: 77 mL/min/{1.73_m2} (ref >59)
GFR calc non Af Amer: 67 mL/min/{1.73_m2} (ref >59)
GLUCOSE: 85 mg/dL (ref 65–99)
POTASSIUM: 4.3 mmol/L (ref 3.5–5.2)
SODIUM: 141 mmol/L (ref 136–144)

## 2014-12-07 LAB — CBC
Hematocrit: 42.7 % (ref 34.0–46.6)
Hemoglobin: 14.1 g/dL (ref 11.1–15.9)
MCH: 30.5 pg (ref 26.6–33.0)
MCHC: 33 g/dL (ref 31.5–35.7)
MCV: 92 fL (ref 79–97)
PLATELETS: 333 10*3/uL (ref 150–379)
RBC: 4.62 x10E6/uL (ref 3.77–5.28)
RDW: 13.9 % (ref 12.3–15.4)
WBC: 9.9 10*3/uL (ref 3.4–10.8)

## 2014-12-13 ENCOUNTER — Telehealth: Payer: Self-pay | Admitting: Pediatrics

## 2014-12-15 NOTE — Telephone Encounter (Signed)
Called and given results

## 2014-12-19 ENCOUNTER — Telehealth: Payer: Self-pay | Admitting: Pediatrics

## 2014-12-19 ENCOUNTER — Other Ambulatory Visit: Payer: Self-pay

## 2014-12-19 MED ORDER — ATORVASTATIN CALCIUM 80 MG PO TABS: 80.0000 mg | ORAL_TABLET | Freq: Every day | ORAL | Status: DC

## 2014-12-19 NOTE — Telephone Encounter (Signed)
Last seen 12/06/14  Dr Evette Doffing  Last lipid 10/11/08

## 2015-01-17 ENCOUNTER — Telehealth: Payer: Self-pay | Admitting: Pediatrics

## 2015-01-17 DIAGNOSIS — H8110 Benign paroxysmal vertigo, unspecified ear: Secondary | ICD-10-CM

## 2015-01-17 MED ORDER — MECLIZINE HCL 12.5 MG PO TABS: 12.5000 mg | ORAL_TABLET | Freq: Three times a day (TID) | ORAL | Status: DC | PRN

## 2015-01-17 NOTE — Telephone Encounter (Signed)
Refill sent in

## 2015-01-30 ENCOUNTER — Telehealth: Payer: Self-pay | Admitting: Pediatrics

## 2015-02-04 NOTE — Telephone Encounter (Signed)
Patient states that it is about the same in her neck but she has noticed some in her hands now. Patient advised that she will need to be seen.

## 2015-02-04 NOTE — Telephone Encounter (Signed)
Are her neck symptoms getting worse or not improving? So far she has had osteoarthritis, not an inflammatory kind of arthritis which is what rheumatology helps with. If shes not getting better she should come back in to be seen.

## 2015-03-08 ENCOUNTER — Telehealth: Payer: Self-pay | Admitting: Pediatrics

## 2015-03-08 NOTE — Telephone Encounter (Signed)
Patient made appointment on 2/15 to see Dr. Evette Doffing to discuss referral to neurologist

## 2015-03-13 ENCOUNTER — Ambulatory Visit: Payer: Self-pay | Admitting: Pediatrics

## 2015-03-14 ENCOUNTER — Encounter: Payer: Self-pay | Admitting: Pediatrics

## 2015-03-14 ENCOUNTER — Ambulatory Visit (INDEPENDENT_AMBULATORY_CARE_PROVIDER_SITE_OTHER): Payer: BLUE CROSS/BLUE SHIELD | Admitting: Pediatrics

## 2015-03-14 VITALS — BP 151/90 | HR 99 | Temp 98.4°F | Ht 61.0 in | Wt 158.6 lb

## 2015-03-14 DIAGNOSIS — R258 Other abnormal involuntary movements: Secondary | ICD-10-CM | POA: Diagnosis not present

## 2015-03-14 DIAGNOSIS — R519 Headache, unspecified: Secondary | ICD-10-CM | POA: Insufficient documentation

## 2015-03-14 DIAGNOSIS — R42 Dizziness and giddiness: Secondary | ICD-10-CM

## 2015-03-14 DIAGNOSIS — I10 Essential (primary) hypertension: Secondary | ICD-10-CM | POA: Insufficient documentation

## 2015-03-14 DIAGNOSIS — R51 Headache: Secondary | ICD-10-CM

## 2015-03-14 DIAGNOSIS — R030 Elevated blood-pressure reading, without diagnosis of hypertension: Secondary | ICD-10-CM

## 2015-03-14 DIAGNOSIS — IMO0001 Reserved for inherently not codable concepts without codable children: Secondary | ICD-10-CM

## 2015-03-14 DIAGNOSIS — R253 Fasciculation: Secondary | ICD-10-CM | POA: Insufficient documentation

## 2015-03-14 NOTE — Progress Notes (Signed)
Subjective:    Patient ID: Taylor Craig, female    DOB: 12-19-1955, 60 y.o.   MRN: [id number]  CC: Dizziness   HPI: Taylor Craig is a 60 y.o. female presenting for Dizziness  Having episodes of headaches that come on suddenly, lasts for a coupl eof seconds, severe pain, gets lighted after episode that lasts for several hours afterwards  Feels lightheaded now, having tingling in the back of her head that comes on when it is hurting  Sometimes there are certain movements that seem to bring on the headache/pressure. Either bending down to lift out laundry caused it once, leaning back on sofa cushions putting pressure on neck caused the sudden pressure followed by the lightheadedness/tingling that went on for hours. Gets blurry vision with the really bad headaches, takes her breath away, has to grab something because it hurts  First time it happened was three years ago Was out of work for a couple of weeks because she was so lightheaded  Now happening more often, apprx twice a week the bad ones  When they happen she stops whatever she is doing and takes something for it Takes nabumetone for severe episodes, tylenol for less severe   Has been followed Dr. Maia Petties for neck pain/degenerative disc/arthritis. He has told her there is nothing in her neck that could be causing this pain, has seen nothing in MRI, brain scan, neck xrays.     Depression screen PHQ 2/9 09/25/2014  Decreased Interest 0  Down, Depressed, Hopeless 0  PHQ - 2 Score 0     Relevant past medical, surgical, family and social history reviewed and updated as indicated. Interim medical history since our last visit reviewed. Allergies and medications reviewed and updated.    ROS: Per HPI unless specifically indicated above  History  Smoking status  . Never Smoker   Smokeless tobacco  . Not on file    Past Medical History Patient Active Problem List   Diagnosis Date Noted  . Tongue fasciculation  03/14/2015  . Episodic lightheadedness 03/14/2015  . Cephalalgia 03/14/2015  . Elevated blood pressure 03/14/2015  . Osteoarthritis of neck 09/25/2014  . BMI 30.0-30.9,adult 09/25/2014  . Hyperlipidemia 09/25/2014    Current Outpatient Prescriptions  Medication Sig Dispense Refill  . atorvastatin (LIPITOR) 80 MG tablet Take 1 tablet (80 mg total) by mouth daily. 30 tablet 5  . baclofen (LIORESAL) 10 MG tablet   0  . etodolac (LODINE) 400 MG tablet     . Multiple Vitamin (MULTIVITAMIN WITH MINERALS) TABS tablet Take 1 tablet by mouth daily.    . nabumetone (RELAFEN) 500 MG tablet   0  . tiZANidine (ZANAFLEX) 2 MG tablet      No current facility-administered medications for this visit.       Objective:    BP 151/90 mmHg  Pulse 99  Temp(Src) 98.4 F (36.9 C) (Oral)  Ht 5\' 1"  (1.549 m)  Wt 158 lb 9.6 oz (71.94 kg)  BMI 29.98 kg/m2  Wt Readings from Last 3 Encounters:  03/14/15 158 lb 9.6 oz (71.94 kg)  12/06/14 163 lb 12.8 oz (74.299 kg)  09/25/14 159 lb 12.8 oz (72.485 kg)      Gen: NAD, alert, cooperative with exam, NCAT EYES: EOMI, no scleral injection or icterus ENT:  TMs pearly gray b/l, OP without erythema LYMPH: no cervical LAD CV: NRRR, normal S1/S2, no murmur, distal pulses 2+ b/l Resp: CTABL, no wheezes, normal WOB Abd: +BS, soft, NTND. no  guarding or organomegaly Ext: No edema, warm Neuro: Alert and oriented, strength equal b/l UE and LE, CN III-XII intact, tongue with fasiculations, coordination grossly normal MSK: normal muscle bulk     Assessment & Plan:    Taylor Craig was seen today for episodic lightheadedness and headaches of unclear etiology. Going to try to control BP at home, let me know if remains elevated. Consider referral to neurology if still ongoing.  Diagnoses and all orders for this visit:  Elevated blood pressure  Tongue fasciculation  Nonintractable headache, unspecified chronicity pattern, unspecified headache type  Episodic  lightheadedness    Follow up plan: Return in about 2 weeks (around 03/28/2015) for 1 week BP nurse check, 2 week visit Dr. Evette Doffing.  Assunta Found, MD [facility] 03/14/2015, 11:42 AM

## 2015-03-15 ENCOUNTER — Other Ambulatory Visit: Payer: Self-pay

## 2015-03-15 DIAGNOSIS — Z1231 Encounter for screening mammogram for malignant neoplasm of breast: Secondary | ICD-10-CM

## 2015-03-21 ENCOUNTER — Ambulatory Visit: Payer: BLUE CROSS/BLUE SHIELD | Admitting: *Deleted

## 2015-03-21 VITALS — BP 132/82 | HR 85

## 2015-03-21 DIAGNOSIS — R030 Elevated blood-pressure reading, without diagnosis of hypertension: Principal | ICD-10-CM

## 2015-03-21 DIAGNOSIS — IMO0001 Reserved for inherently not codable concepts without codable children: Secondary | ICD-10-CM

## 2015-03-21 NOTE — Progress Notes (Signed)
Pt here today for 1 week BP check. Pt states she has been taking her BP at home and it has been normal. BP was normal in office today.

## 2015-03-27 ENCOUNTER — Ambulatory Visit: Payer: BLUE CROSS/BLUE SHIELD | Admitting: Pediatrics

## 2015-03-27 ENCOUNTER — Encounter: Payer: Self-pay | Admitting: Pediatrics

## 2015-03-27 ENCOUNTER — Ambulatory Visit (INDEPENDENT_AMBULATORY_CARE_PROVIDER_SITE_OTHER): Payer: BLUE CROSS/BLUE SHIELD | Admitting: Pediatrics

## 2015-03-27 DIAGNOSIS — R030 Elevated blood-pressure reading, without diagnosis of hypertension: Secondary | ICD-10-CM | POA: Diagnosis not present

## 2015-03-27 DIAGNOSIS — R519 Headache, unspecified: Secondary | ICD-10-CM

## 2015-03-27 DIAGNOSIS — R42 Dizziness and giddiness: Secondary | ICD-10-CM | POA: Diagnosis not present

## 2015-03-27 DIAGNOSIS — R51 Headache: Secondary | ICD-10-CM | POA: Diagnosis not present

## 2015-03-27 DIAGNOSIS — M47812 Spondylosis without myelopathy or radiculopathy, cervical region: Secondary | ICD-10-CM

## 2015-03-27 DIAGNOSIS — IMO0001 Reserved for inherently not codable concepts without codable children: Secondary | ICD-10-CM

## 2015-03-27 NOTE — Progress Notes (Signed)
Subjective:    Patient ID: Taylor Craig, female    DOB: Apr 20, 1955, 60 y.o.   MRN: [id number]  CC: Follow-up headaches and neck pain  HPI: Taylor Craig is a 60 y.o. female presenting for Follow-up  HTN: 120s/70s at home  Still has tingling in the back of her head Not spinning, not vertigo Lifting makes it worse  Ongoing since last June Was seen in physical therapy, didn't help Takes tylenol in the morning a couple times a week Does ok as long as she doesn't do anything strenuous Taking baclofen when neck pain is severe, has taken it twice in the past 2 weeks, takes the edge off of with pain  Not taking nabutamone because makes her sleepy Not taking any other NSAIDs  HA: feels like pressure and tingling from neck up to top of head Comes on most days   Depression screen [facility] 2/9 03/27/2015 09/25/2014  Decreased Interest 0 0  Down, Depressed, Hopeless 0 0  PHQ - 2 Score 0 0     Relevant past medical, surgical, family and social history reviewed and updated as indicated. Interim medical history since our last visit reviewed. Allergies and medications reviewed and updated.    ROS: Per HPI unless specifically indicated above  History  Smoking status  . Never Smoker   Smokeless tobacco  . Not on file    Past Medical History Patient Active Problem List   Diagnosis Date Noted  . Tongue fasciculation 03/14/2015  . Episodic lightheadedness 03/14/2015  . Cephalalgia 03/14/2015  . Elevated blood pressure 03/14/2015  . Osteoarthritis of neck 09/25/2014  . BMI 30.0-30.9,adult 09/25/2014  . Hyperlipidemia 09/25/2014    Current Outpatient Prescriptions  Medication Sig Dispense Refill  . atorvastatin (LIPITOR) 80 MG tablet Take 1 tablet (80 mg total) by mouth daily. 30 tablet 5  . baclofen (LIORESAL) 10 MG tablet   0  . Multiple Vitamin (MULTIVITAMIN WITH MINERALS) TABS tablet Take 1 tablet by mouth daily.    . nabumetone (RELAFEN) 500 MG tablet Take 500 mg by  mouth daily.     No current facility-administered medications for this visit.       Objective:    BP 143/84 mmHg  Pulse 83  Temp(Src) 97.1 F (36.2 C) (Oral)  Ht 5\' 1"  (1.549 m)  Wt 159 lb 6.4 oz (72.303 kg)  BMI 30.13 kg/m2  Wt Readings from Last 3 Encounters:  03/27/15 159 lb 6.4 oz (72.303 kg)  03/14/15 158 lb 9.6 oz (71.94 kg)  12/06/14 163 lb 12.8 oz (74.299 kg)      Gen: NAD, alert, cooperative with exam, NCAT EYES: EOMI, no scleral injection or icterus CV: WWP Resp:  normal WOB Ext: No edema, warm Neuro: Alert and oriented, strength equal b/l UE and LE, coordination grossly normal MSK: normal muscle bulk     Assessment & Plan:    Taylor Craig was seen today for follow-up multiple med problems. Pt still with neck pain, ongoing, of unclear etiology. Not regularly taking any medicine for it. Starts with neck pain, progresses to headaches. Rec taking aleve as needed for neck pain, dont take with nabutamone which pt is not taking any way. OK to take baclofen more regularly to help with neck spasms. Could consider taking a ppx med for headaches, though seem more related to pain in neck/spasm rather than tension headaches. Pt has disability paperwork that needs filling out, said she would need to come back in for paperwork filling out.  Diagnoses and all orders for this visit:  Osteoarthritis of neck  Elevated blood pressure  Nonintractable headache, unspecified chronicity pattern, unspecified headache type  Episodic lightheadedness    Follow up plan: Return for when able for paperwork.  Assunta Found, MD [facility] 03/27/2015, 3:08 PM

## 2015-04-01 ENCOUNTER — Ambulatory Visit (INDEPENDENT_AMBULATORY_CARE_PROVIDER_SITE_OTHER): Payer: BLUE CROSS/BLUE SHIELD | Admitting: Pediatrics

## 2015-04-01 ENCOUNTER — Encounter: Payer: Self-pay | Admitting: Pediatrics

## 2015-04-01 VITALS — BP 134/76 | HR 82 | Temp 98.9°F | Ht 61.0 in | Wt 160.0 lb

## 2015-04-01 DIAGNOSIS — R519 Headache, unspecified: Secondary | ICD-10-CM

## 2015-04-01 DIAGNOSIS — R42 Dizziness and giddiness: Secondary | ICD-10-CM

## 2015-04-01 DIAGNOSIS — R51 Headache: Secondary | ICD-10-CM

## 2015-04-01 NOTE — Progress Notes (Signed)
    Subjective:    Patient ID: Taylor Craig, female    DOB: [birth year], 60 y.o.   MRN: [id number]  CC: disability forms    HPI: Taylor Craig is a 60 y.o. female presenting for disability forms   Ongoing symptoms of neck pain with tilting head that sends her into vertigo/lightheadedness that can last for 2-3 hours. Symptoms present consistently since June 2016, has not been able to work since then as she is not able to lift more than apprx 5 lbs without the symptoms of lightheadedness and neck/head pain returning. Has good days and bad days, depends on how active sheis and how much she tries to do. Doing more tends to make symptoms worse because moving tends to symptoms worse unless she is very careful about how she is moving.   Depression screen [facility] 2/9 03/27/2015 09/25/2014  Decreased Interest 0 0  Down, Depressed, Hopeless 0 0  PHQ - 2 Score 0 0     Relevant past medical, surgical, family and social history reviewed and updated as indicated. Interim medical history since our last visit reviewed. Allergies and medications reviewed and updated.    ROS: Per HPI unless specifically indicated above  History  Smoking status  . Never Smoker   Smokeless tobacco  . Not on file    Past Medical History Patient Active Problem List   Diagnosis Date Noted  . Vertigo 04/01/2015  . Tongue fasciculation 03/14/2015  . Episodic lightheadedness 03/14/2015  . Cephalalgia 03/14/2015  . Elevated blood pressure 03/14/2015  . Osteoarthritis of neck 09/25/2014  . BMI 30.0-30.9,adult 09/25/2014  . Hyperlipidemia 09/25/2014       Objective:    BP 134/76 mmHg  Pulse 82  Temp(Src) 98.9 F (37.2 C) (Oral)  Ht 5\' 1"  (1.549 m)  Wt 160 lb (72.576 kg)  BMI 30.25 kg/m2  Wt Readings from Last 3 Encounters:  04/01/15 160 lb (72.576 kg)  03/27/15 159 lb 6.4 oz (72.303 kg)  03/14/15 158 lb 9.6 oz (71.94 kg)     Gen: NAD, alert, cooperative with exam, NCAT EYES: EOMI, no scleral  injection or icterus ENT:  TMs pearly gray b/l, OP without erythema LYMPH: no cervical LAD CV: NRRR, normal S1/S2, no murmur, distal pulses 2+ b/l Resp: CTABL, no wheezes, normal WOB Abd: +BS, soft, NTND. no guarding or organomegaly Ext: No edema, warm Neuro: Alert and oriented, strength equal b/l UE and LE, cerebellar function intact, function intact, coordination grossly normal. 2+ patellar reflexes b/l, gets slightly off balance after 10-15 sec with romberg. MSK: normal ROM neck with L and R gaze and tilt, moves slowly to prevent onset of symptoms.     Assessment & Plan:    [place] was seen today for disability forms, ongoing below problems. Medicines have had slight improvement to symptoms but symptoms ongoing.  Diagnoses and all orders for this visit:  Vertigo -     Ambulatory referral to Neurology  Episodic lightheadedness -     Ambulatory referral to Neurology  Nonintractable headache, unspecified chronicity pattern, unspecified headache type   Follow up plan: Return in about 4 weeks (around 04/29/2015).  Assunta Found, MD [facility] 04/01/2015, 11:51 AM

## 2015-04-03 ENCOUNTER — Other Ambulatory Visit: Payer: Self-pay | Admitting: *Deleted

## 2015-04-03 ENCOUNTER — Other Ambulatory Visit: Payer: BLUE CROSS/BLUE SHIELD

## 2015-04-03 DIAGNOSIS — Z09 Encounter for follow-up examination after completed treatment for conditions other than malignant neoplasm: Secondary | ICD-10-CM

## 2015-04-04 LAB — FECAL OCCULT BLOOD, IMMUNOCHEMICAL: Fecal Occult Bld: NEGATIVE

## 2015-04-24 ENCOUNTER — Ambulatory Visit: Payer: BLUE CROSS/BLUE SHIELD | Admitting: Pediatrics

## 2015-04-26 ENCOUNTER — Ambulatory Visit: Payer: Self-pay

## 2015-04-29 ENCOUNTER — Encounter: Payer: Self-pay | Admitting: Neurology

## 2015-04-29 ENCOUNTER — Ambulatory Visit (INDEPENDENT_AMBULATORY_CARE_PROVIDER_SITE_OTHER): Payer: BLUE CROSS/BLUE SHIELD | Admitting: Neurology

## 2015-04-29 ENCOUNTER — Ambulatory Visit
Admission: RE | Admit: 2015-04-29 | Discharge: 2015-04-29 | Disposition: A | Discharge status: Home / Self Care | Payer: BLUE CROSS/BLUE SHIELD | Source: Ambulatory Visit

## 2015-04-29 VITALS — BP 142/82 | HR 94 | Ht 61.0 in | Wt 159.0 lb

## 2015-04-29 DIAGNOSIS — M5412 Radiculopathy, cervical region: Secondary | ICD-10-CM

## 2015-04-29 DIAGNOSIS — R519 Headache, unspecified: Secondary | ICD-10-CM

## 2015-04-29 DIAGNOSIS — R42 Dizziness and giddiness: Secondary | ICD-10-CM | POA: Diagnosis not present

## 2015-04-29 DIAGNOSIS — Z1231 Encounter for screening mammogram for malignant neoplasm of breast: Secondary | ICD-10-CM

## 2015-04-29 DIAGNOSIS — M792 Neuralgia and neuritis, unspecified: Secondary | ICD-10-CM

## 2015-04-29 DIAGNOSIS — G609 Hereditary and idiopathic neuropathy, unspecified: Secondary | ICD-10-CM

## 2015-04-29 DIAGNOSIS — R51 Headache: Secondary | ICD-10-CM

## 2015-04-29 MED ORDER — DULOXETINE HCL 30 MG PO CPEP: 30.0000 mg | ORAL_CAPSULE | Freq: Every day | ORAL | Status: DC

## 2015-04-29 NOTE — Progress Notes (Signed)
NEUROLOGY CONSULTATION NOTE  Taylor Craig MRN: [id number] DOB: 08-24-1955  Referring provider: Dr. Evette Doffing Primary care provider: Dr. Evette Doffing  Reason for consult:  dizziness  HISTORY OF PRESENT ILLNESS: Taylor Craig is a 60 year old right-handed female with history of melanoma who presents for vertigo and headache.  History obtained by patient, her husband and PCP note.  Imaging of remote brain MRI reviewed.  In June 2016, she woke up int he middle of the night.  She went to pull the blanket up over her when she felt sudden onset of neck pain, causing heaviness in head and blurred vision.  It lasted only a brief time, but the neck pain persisted.  MRI of cervical spine at that time revealed multi-level spondylosis with degenerative disc disease, causing moderate nerve impingement at C5-6 and mild foraminal stenosis at C3-4, C4-5 and C6-7.  She has been treated with pharmacologic medications and injections.    Neck pain has resolved, but she notes paresthesias from across the shoulders and radiating up the back and sides of her neck to the temples and top of her head.  Gabapentin was ineffective.  Amitriptyline caused side effects.  Along with this, she experiences dizziness, described as a "wooziness" but not spinning.  There is no associated nausea, double vision, or hearing disturbance.  It occurs spontaneously, not particularly positional, and lasts several hours.  She had an MRI of the brain with and without contrast back on 02/02/03 for staging of melanoma.  It was normal with no evidence of metastasis.  PAST MEDICAL HISTORY: Past Medical History  Diagnosis Date  . Hyperlipemia   . Cancer ([place]) 2004    melanoma     PAST SURGICAL HISTORY: Past Surgical History  Procedure Laterality Date  . Skin cancer excision    . Abdominal hysterectomy      MEDICATIONS: Current Outpatient Prescriptions on File Prior to Visit  Medication Sig Dispense Refill  . atorvastatin (LIPITOR)  80 MG tablet Take 1 tablet (80 mg total) by mouth daily. 30 tablet 5  . baclofen (LIORESAL) 10 MG tablet   0  . Multiple Vitamin (MULTIVITAMIN WITH MINERALS) TABS tablet Take 1 tablet by mouth daily.    . nabumetone (RELAFEN) 500 MG tablet Take 500 mg by mouth daily.     No current facility-administered medications on file prior to visit.    ALLERGIES: No Known Allergies  FAMILY HISTORY: History reviewed. No pertinent family history.  SOCIAL HISTORY: Social History   Social History  . Marital Status: Married    Spouse Name: N/A  . Number of Children: N/A  . Years of Education: N/A   Occupational History  . Not on file.   Social History Main Topics  . Smoking status: Never Smoker   . Smokeless tobacco: Not on file  . Alcohol Use: No  . Drug Use: No  . Sexual Activity: Yes   Other Topics Concern  . Not on file   Social History Narrative    REVIEW OF SYSTEMS: Constitutional: No fevers, chills, or sweats, no generalized fatigue, change in appetite Eyes: No visual changes, double vision, eye pain Ear, nose and throat: No hearing loss, ear pain, nasal congestion, sore throat Cardiovascular: No chest pain, palpitations Respiratory:  No shortness of breath at rest or with exertion, wheezes GastrointestinaI: No nausea, vomiting, diarrhea, abdominal pain, fecal incontinence Genitourinary:  No dysuria, urinary retention or frequency Musculoskeletal:  No neck pain, back pain Integumentary: No rash, pruritus, skin lesions Neurological: as  above Psychiatric: No depression, insomnia, anxiety Endocrine: No palpitations, fatigue, diaphoresis, mood swings, change in appetite, change in weight, increased thirst Hematologic/Lymphatic:  No anemia, purpura, petechiae. Allergic/Immunologic: no itchy/runny eyes, nasal congestion, recent allergic reactions, rashes  PHYSICAL EXAM: Filed Vitals:   04/29/15 1417  BP: 142/82  Pulse: 94   General: No acute distress.  Patient appears  well-groomed.  Head:  Normocephalic/atraumatic Eyes:  fundi unremarkable, without vessel changes, exudates, hemorrhages or papilledema. Neck: supple, no paraspinal tenderness, full range of motion Back: No paraspinal tenderness Heart: regular rate and rhythm Lungs: Clear to auscultation bilaterally. Vascular: No carotid bruits. Neurological Exam: Mental status: alert and oriented to person, place, and time, recent and remote memory intact, fund of knowledge intact, attention and concentration intact, speech fluent and not dysarthric, language intact. Cranial nerves: CN I: not tested CN II: pupils equal, round and reactive to light, visual fields intact, fundi unremarkable, without vessel changes, exudates, hemorrhages or papilledema. CN III, IV, VI:  full range of motion, no nystagmus, no ptosis CN V: facial sensation intact CN VII: upper and lower face symmetric CN VIII: hearing intact CN IX, X: gag intact, uvula midline CN XI: sternocleidomastoid and trapezius muscles intact CN XII: tongue midline Bulk & Tone: normal, no fasciculations. Motor:  5/5 throughout Sensation: temperature and vibration sensation intact. Deep Tendon Reflexes:  3+ throughout, toes downgoing.  Finger to nose testing:  Without dysmetria.  Heel to shin:  Without dysmetria.  Gait:  Cautious.  Able to turn and tandem walk. Romberg negative.  IMPRESSION: 1.  Paresthesias/neuralgia from neck radiating up the posterior and side of head.  Possibly cervicogenic as she does have nerve root impingement 2.  Cervical spondylosis. 3.   Lightheadedness.  Etiology unclear.  It is not consistent with BPPV  PLAN: 1.  Given her history of melanoma, we will get MRI of brain with and without contrast to further evaluate lightheadedness, headache and paresthesias involving the head. 2.  To treat the paresthesias, will try Cymbalta 30mg  daily.  She will contact us in 4 weeks with update. 3.  Follow up 4.  BP borderline  elevated.  Recheck with PCP.  Thank you for allowing me to take part in the care of this patient.  Metta Clines, DO  CC:  Assunta Found, MD

## 2015-04-29 NOTE — Patient Instructions (Signed)
1.  To treat the tingling sensation in the head, we will start Cymbalta 30mg  daily.  Contact us in 4 weeks with update (or sooner if having side effects) 2.  MRI of brain with and without contrast 3.  Follow up in 3 to 4 months

## 2015-05-03 ENCOUNTER — Telehealth: Payer: Self-pay | Admitting: Neurology

## 2015-05-03 NOTE — Telephone Encounter (Signed)
Taylor Craig 12/29/1955 called and said she cannot take the medication that Dr. Tomi Likens prescribed at her appointment on Monday. Her number if needed is [phone]. Thank you

## 2015-05-03 NOTE — Telephone Encounter (Signed)
Pt called and stated she cannot take they Cymbalta that was prescribed for the paresthesias. Pt states the medication makes her feel anxious, she is so tired but cannot sleep. Pt took 2 doses of medication. Pt states she will not take another. It was "too scary". Please advise.

## 2015-05-07 ENCOUNTER — Other Ambulatory Visit (HOSPITAL_COMMUNITY): Payer: Self-pay

## 2015-05-08 ENCOUNTER — Ambulatory Visit (HOSPITAL_COMMUNITY): Payer: BLUE CROSS/BLUE SHIELD

## 2015-06-12 ENCOUNTER — Other Ambulatory Visit: Payer: Self-pay | Admitting: Pediatrics

## 2015-08-06 ENCOUNTER — Ambulatory Visit: Payer: Self-pay | Admitting: Neurology

## 2015-08-23 ENCOUNTER — Ambulatory Visit: Payer: Self-pay | Admitting: Neurology

## 2015-11-25 ENCOUNTER — Ambulatory Visit (INDEPENDENT_AMBULATORY_CARE_PROVIDER_SITE_OTHER): Payer: BLUE CROSS/BLUE SHIELD

## 2015-11-25 DIAGNOSIS — Z23 Encounter for immunization: Secondary | ICD-10-CM | POA: Diagnosis not present

## 2015-12-09 ENCOUNTER — Other Ambulatory Visit: Payer: Self-pay | Admitting: Pediatrics

## 2015-12-11 ENCOUNTER — Other Ambulatory Visit: Payer: Self-pay | Admitting: *Deleted

## 2015-12-11 ENCOUNTER — Telehealth: Payer: Self-pay | Admitting: Pediatrics

## 2015-12-11 MED ORDER — ATORVASTATIN CALCIUM 80 MG PO TABS: ORAL_TABLET | ORAL | 5 refills | Status: DC

## 2015-12-11 NOTE — Telephone Encounter (Signed)
Please advise 

## 2015-12-11 NOTE — Telephone Encounter (Signed)
Patient aware rx sent in  

## 2016-03-20 ENCOUNTER — Other Ambulatory Visit: Payer: Self-pay | Admitting: Pediatrics

## 2016-03-20 DIAGNOSIS — Z1231 Encounter for screening mammogram for malignant neoplasm of breast: Secondary | ICD-10-CM

## 2016-04-29 ENCOUNTER — Ambulatory Visit
Admission: RE | Admit: 2016-04-29 | Discharge: 2016-04-29 | Disposition: A | Discharge status: Home / Self Care | Payer: BLUE CROSS/BLUE SHIELD | Source: Ambulatory Visit | Attending: Pediatrics | Admitting: Pediatrics

## 2016-04-29 DIAGNOSIS — Z1231 Encounter for screening mammogram for malignant neoplasm of breast: Secondary | ICD-10-CM

## 2016-06-04 ENCOUNTER — Other Ambulatory Visit: Payer: Self-pay | Admitting: *Deleted

## 2016-06-05 ENCOUNTER — Other Ambulatory Visit: Payer: Self-pay | Admitting: Pediatrics

## 2016-07-27 ENCOUNTER — Encounter: Payer: Self-pay | Admitting: Pediatrics

## 2016-07-27 ENCOUNTER — Ambulatory Visit (INDEPENDENT_AMBULATORY_CARE_PROVIDER_SITE_OTHER): Payer: BLUE CROSS/BLUE SHIELD | Admitting: Pediatrics

## 2016-07-27 ENCOUNTER — Encounter: Payer: Self-pay | Admitting: *Deleted

## 2016-07-27 VITALS — BP 142/79 | HR 95 | Temp 98.1°F | Ht 61.0 in | Wt 154.6 lb

## 2016-07-27 DIAGNOSIS — Z23 Encounter for immunization: Secondary | ICD-10-CM | POA: Diagnosis not present

## 2016-07-27 DIAGNOSIS — I10 Essential (primary) hypertension: Secondary | ICD-10-CM

## 2016-07-27 DIAGNOSIS — R030 Elevated blood-pressure reading, without diagnosis of hypertension: Secondary | ICD-10-CM

## 2016-07-27 DIAGNOSIS — Z0000 Encounter for general adult medical examination without abnormal findings: Secondary | ICD-10-CM | POA: Diagnosis not present

## 2016-07-27 DIAGNOSIS — Z85820 Personal history of malignant melanoma of skin: Secondary | ICD-10-CM

## 2016-07-27 DIAGNOSIS — R7309 Other abnormal glucose: Secondary | ICD-10-CM

## 2016-07-27 NOTE — Addendum Note (Signed)
Addended by: Wardell Heath on: 07/27/2016 02:31 PM   Modules accepted: Orders

## 2016-07-27 NOTE — Progress Notes (Signed)
  Subjective:   Patient ID: Taylor Craig, female    DOB: 04/24/1955, 61 y.o.   MRN: [id number] CC: Annual Exam  HPI: Taylor Craig is a 61 y.o. female presenting for Annual Exam  Feeling well, can walk as much as she likes Limited ROM with neck When she does too much lifting with her arms will sometimes have neck pain No weakness in her hands/UE   Elevated blood pressure: not checking at home  Neck pain: seeing spine specialist Surgery not possible  Paresthesias: doesn't want to be on antidepressant Tried on amitriptyline, cymbalta in the past Says they make her feel tired and not herself Getting trigger point injections as needed through neurology  Relevant past medical, surgical, family and social history reviewed. Allergies and medications reviewed and updated. History  Smoking Status  . Never Smoker  Smokeless Tobacco  . Never Used   ROS: Per HPI   Objective:    BP (!) 142/79   Pulse 95   Temp 98.1 F (36.7 C) (Oral)   Ht '5\' 1"'$  (1.549 m)   Wt 154 lb 9.6 oz (70.1 kg)   BMI 29.21 kg/m   Wt Readings from Last 3 Encounters:  07/27/16 154 lb 9.6 oz (70.1 kg)  04/29/15 159 lb (72.1 kg)  04/01/15 160 lb (72.6 kg)    Gen: NAD, alert, cooperative with exam, NCAT EYES: EOMI, no conjunctival injection, or no icterus ENT:  TMs pearly gray b/l, OP without erythema LYMPH: no cervical LAD CV: NRRR, normal S1/S2, no murmur, distal pulses 2+ b/l Resp: CTABL, no wheezes, normal WOB Abd: +BS, soft, NTND. no guarding or organomegaly Ext: No edema, warm Neuro: Alert and oriented, strength equal b/l UE and LE, coordination grossly normal MSK: normal muscle bulk Skin: several scattered flat-topped rough flesh to brown colored papules scattered over back, upper chest  Assessment & Plan:  Taylor Craig was seen today for annual exam.  Diagnoses and all orders for this visit:  Encounter for preventive health examination Walking regularly, cont increase fruit/veg intake FIT  test given mammo UTD Tdap given today -     Hepatitis C antibody -     CMP14+EGFR -     Lipid panel  HLD Stable, taking statin regularly  H/O malignant melanoma of skin Needs to follow up with derm for annual skin exam  Essential hypertension Regularly elevated  Pt very hesitant to start medication Will check BPs at home for next few weeks, rtc for repeat BP Likely will need bp med started  Follow up plan: Return in about 4 weeks (around 08/24/2016) for blood pressure visit. Assunta Found, MD [place]

## 2016-07-27 NOTE — Patient Instructions (Addendum)
Bring list of blood pressures from home and your blood pressure cuff to next visit Goal 120s/70s

## 2016-07-28 ENCOUNTER — Other Ambulatory Visit: Payer: Self-pay | Admitting: *Deleted

## 2016-07-28 DIAGNOSIS — R7309 Other abnormal glucose: Secondary | ICD-10-CM

## 2016-07-28 LAB — CMP14+EGFR
ALK PHOS: 64 IU/L (ref 39–117)
ALT: 40 IU/L — AB (ref 0–32)
AST: 31 IU/L (ref 0–40)
Albumin/Globulin Ratio: 1.6 (ref 1.2–2.2)
Albumin: 4.7 g/dL (ref 3.6–4.8)
BILIRUBIN TOTAL: 0.3 mg/dL (ref 0.0–1.2)
BUN/Creatinine Ratio: 17 (ref 12–28)
BUN: 15 mg/dL (ref 8–27)
CO2: 22 mmol/L (ref 20–29)
Calcium: 10.2 mg/dL (ref 8.7–10.3)
Chloride: 101 mmol/L (ref 96–106)
Creatinine, Ser: 0.87 mg/dL (ref 0.57–1.00)
GFR calc non Af Amer: 72 mL/min/{1.73_m2} (ref >59)
GFR, EST AFRICAN AMERICAN: 83 mL/min/{1.73_m2} (ref >59)
GLUCOSE: 110 mg/dL — AB (ref 65–99)
Globulin, Total: 2.9 g/dL (ref 1.5–4.5)
POTASSIUM: 4.6 mmol/L (ref 3.5–5.2)
Sodium: 142 mmol/L (ref 134–144)
TOTAL PROTEIN: 7.6 g/dL (ref 6.0–8.5)

## 2016-07-28 LAB — HEPATITIS C ANTIBODY

## 2016-07-28 LAB — BAYER DCA HB A1C WAIVED: HB A1C: 5.4 % (ref <7.0)

## 2016-07-28 LAB — LIPID PANEL
CHOL/HDL RATIO: 2.7 ratio (ref 0.0–4.4)
Cholesterol, Total: 190 mg/dL (ref 100–199)
HDL: 70 mg/dL (ref >39)
LDL CALC: 88 mg/dL (ref 0–99)
Triglycerides: 160 mg/dL — ABNORMAL HIGH (ref 0–149)
VLDL CHOLESTEROL CAL: 32 mg/dL (ref 5–40)

## 2016-07-28 NOTE — Addendum Note (Signed)
Addended by: Liliane Bade on: 07/28/2016 05:00 PM   Modules accepted: Orders

## 2016-08-03 ENCOUNTER — Other Ambulatory Visit: Payer: BLUE CROSS/BLUE SHIELD

## 2016-08-03 DIAGNOSIS — Z0000 Encounter for general adult medical examination without abnormal findings: Secondary | ICD-10-CM

## 2016-08-06 LAB — FECAL OCCULT BLOOD, IMMUNOCHEMICAL: Fecal Occult Bld: NEGATIVE

## 2016-08-25 ENCOUNTER — Encounter: Payer: Self-pay | Admitting: Pediatrics

## 2016-08-25 ENCOUNTER — Ambulatory Visit (INDEPENDENT_AMBULATORY_CARE_PROVIDER_SITE_OTHER): Payer: BLUE CROSS/BLUE SHIELD | Admitting: Pediatrics

## 2016-08-25 VITALS — BP 134/89 | HR 92 | Temp 98.6°F | Ht 61.0 in | Wt 153.0 lb

## 2016-08-25 DIAGNOSIS — E785 Hyperlipidemia, unspecified: Secondary | ICD-10-CM

## 2016-08-25 DIAGNOSIS — R030 Elevated blood-pressure reading, without diagnosis of hypertension: Secondary | ICD-10-CM

## 2016-08-25 NOTE — Progress Notes (Signed)
  Subjective:   Patient ID: KENNLEY SCHWANDT, female    DOB: [birth year], 61 y.o.   MRN: [id number] CC: Blood Pressure Check  HPI: DOYCE STONEHOUSE is a 61 y.o. female presenting for Blood Pressure Check  No HA, no CP Checking BPs at home 110s-120s/60s-80s  HLD: stable, on statin  Relevant past medical, surgical, family and social history reviewed. Allergies and medications reviewed and updated. History  Smoking Status  . Never Smoker  Smokeless Tobacco  . Never Used   ROS: Per HPI   Objective:    BP 134/89   Pulse 92   Temp 98.6 F (37 C) (Oral)   Ht 5\' 1"  (1.549 m)   Wt 153 lb (69.4 kg)   BMI 28.91 kg/m   Wt Readings from Last 3 Encounters:  08/25/16 153 lb (69.4 kg)  07/27/16 154 lb 9.6 oz (70.1 kg)  04/29/15 159 lb (72.1 kg)    Gen: NAD, alert, cooperative with exam, NCAT EYES: EOMI, no conjunctival injection, or no icterus CV: NRRR, normal S1/S2, no murmur, distal pulses 2+ b/l Resp: CTABL, no wheezes, normal WOB Ext: No edema, warm Neuro: Alert and oriented  Assessment & Plan:  Dayzha was seen today for med follow up  Diagnoses and all orders for this visit:  Hyperlipidemia, unspecified hyperlipidemia type Stable, cont statin  White coat syndrome without diagnosis of hypertension Normal BPs out of clinic, brought numbers checking regularly for past month, at least 20 readings Slightly elevated today in clinic  Follow up plan: 11 mo Assunta Found, MD [place]

## 2016-10-11 ENCOUNTER — Other Ambulatory Visit: Payer: Self-pay | Admitting: Pediatrics

## 2016-10-22 ENCOUNTER — Ambulatory Visit (INDEPENDENT_AMBULATORY_CARE_PROVIDER_SITE_OTHER): Payer: BLUE CROSS/BLUE SHIELD

## 2016-10-22 DIAGNOSIS — Z23 Encounter for immunization: Secondary | ICD-10-CM | POA: Diagnosis not present

## 2017-01-08 ENCOUNTER — Other Ambulatory Visit: Payer: Self-pay | Admitting: Pediatrics

## 2017-04-10 ENCOUNTER — Other Ambulatory Visit: Payer: Self-pay | Admitting: Pediatrics

## 2017-04-22 ENCOUNTER — Other Ambulatory Visit: Payer: Self-pay | Admitting: Pediatrics

## 2017-04-22 DIAGNOSIS — Z139 Encounter for screening, unspecified: Secondary | ICD-10-CM

## 2017-05-13 ENCOUNTER — Ambulatory Visit
Admission: RE | Admit: 2017-05-13 | Discharge: 2017-05-13 | Disposition: A | Discharge status: Home / Self Care | Payer: BLUE CROSS/BLUE SHIELD | Source: Ambulatory Visit | Attending: Pediatrics | Admitting: Pediatrics

## 2017-05-13 DIAGNOSIS — Z139 Encounter for screening, unspecified: Secondary | ICD-10-CM

## 2017-06-08 IMAGING — MR MR CERVICAL SPINE W/O CM
4 of 5 series · 14 of 48 positions shown · non-contrast
Comparison: 07/16/2014

CLINICAL DATA: Neck pain for the past 2-3 weeks. History of
melanoma excision.

EXAM:
MRI CERVICAL SPINE WITHOUT CONTRAST
TECHNIQUE: Multiplanar, multisequence MR imaging of the cervical spine was
performed. No intravenous contrast was administered.

[Series 3: T2 · sagittal · 3.0mm · 0.37mm/px · 5 of 13 slices shown (1 of 2)]
[im 1/13]
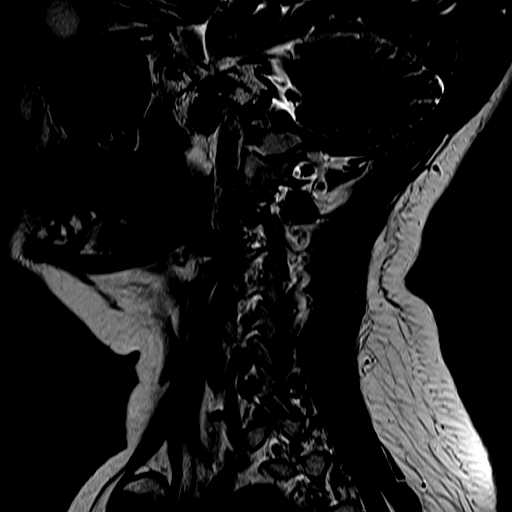
[im 4/13]
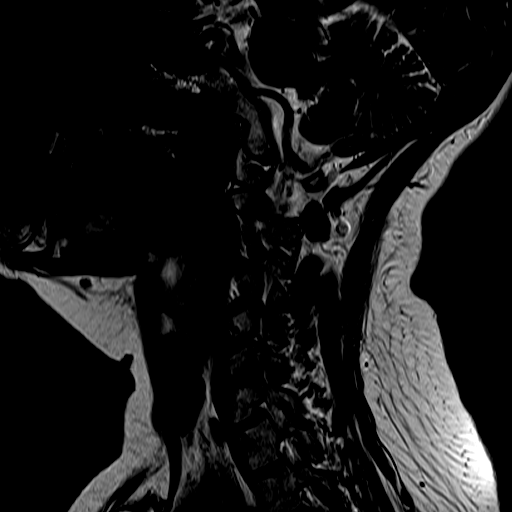
[im 7/13]
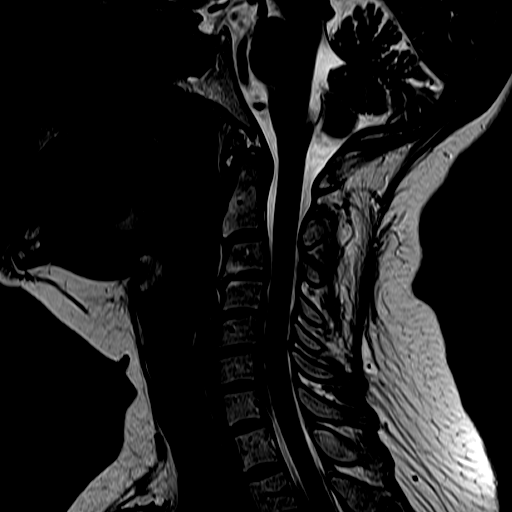
[im 10/13]
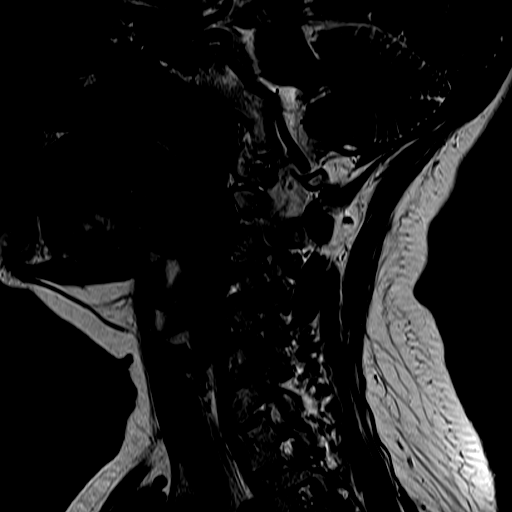
[im 13/13]
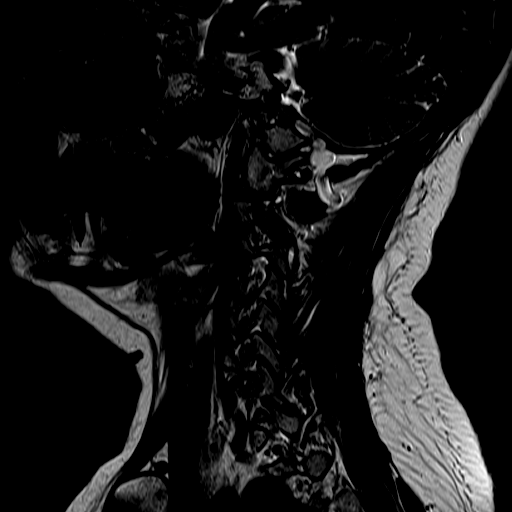

[Series 4: FLAIR · sagittal · 3.0mm · 0.41mm/px · 3 of 13 slices shown]
[im 1/13]
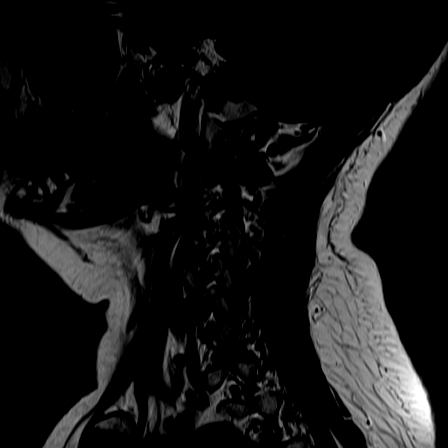
[im 7/13]
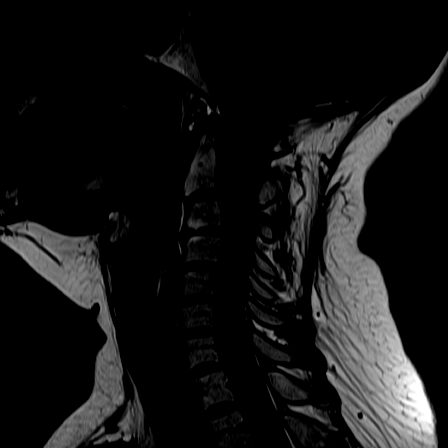
[im 13/13]
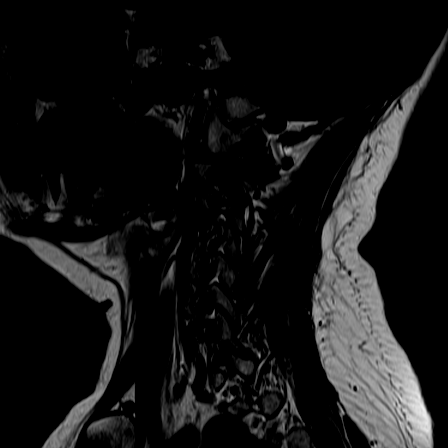

[Series 5: ir sagital · sagittal · 3.0mm · 0.22mm/px · 3 of 13 slices shown]
[im 3/13]
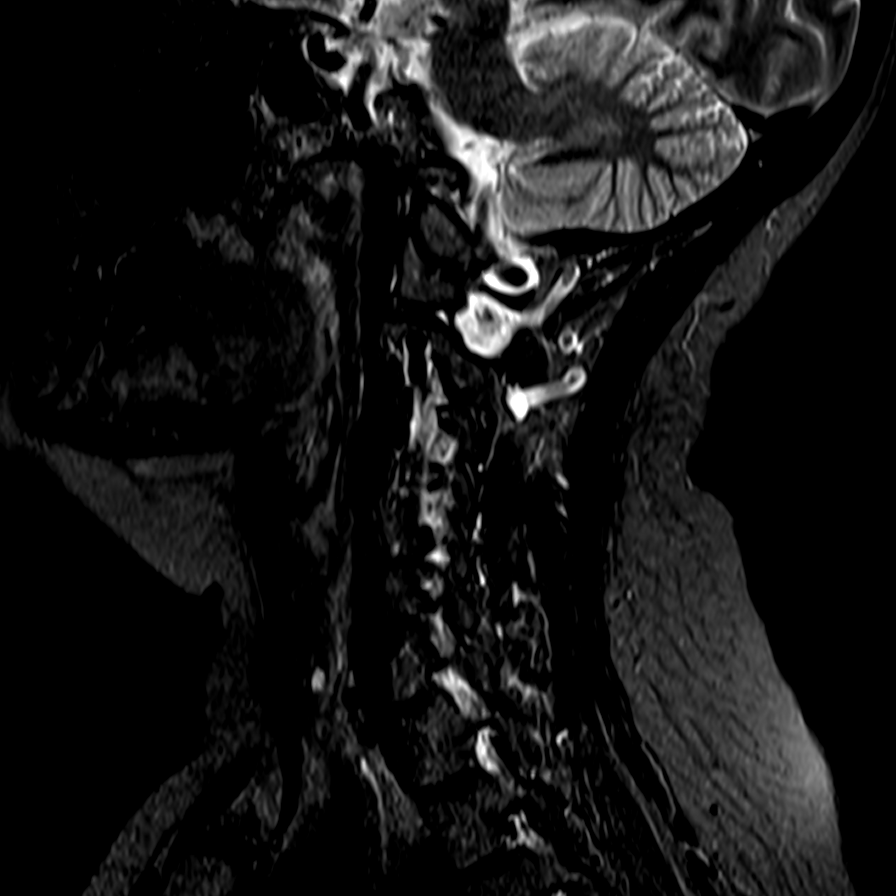
[im 8/13]
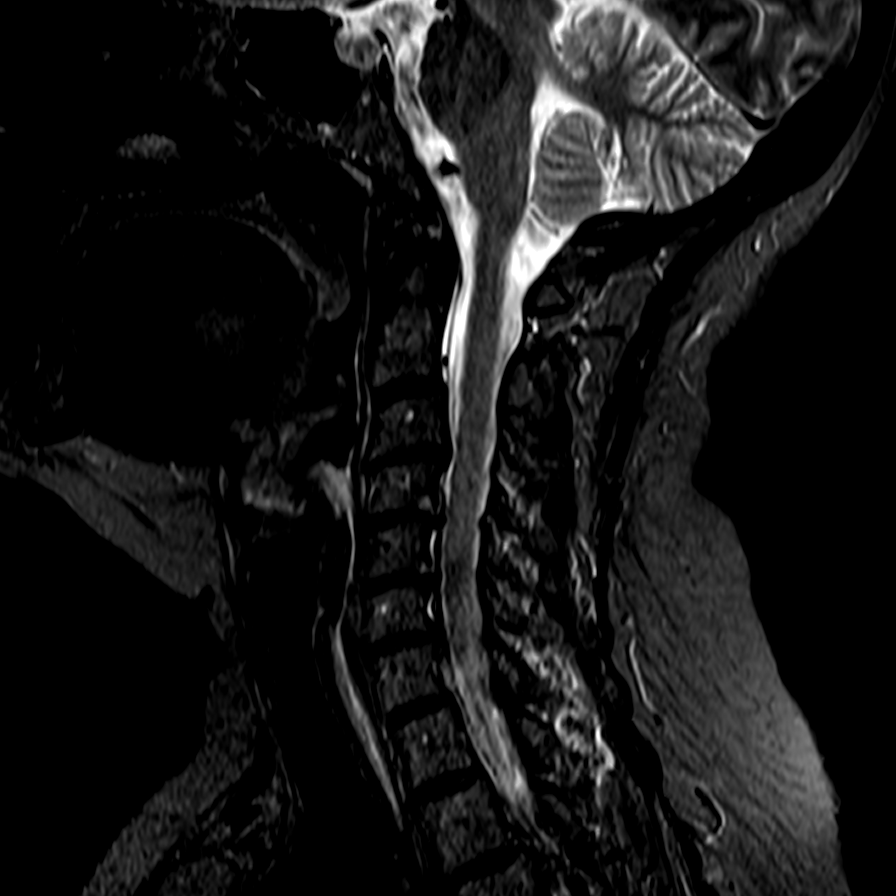
[im 13/13]
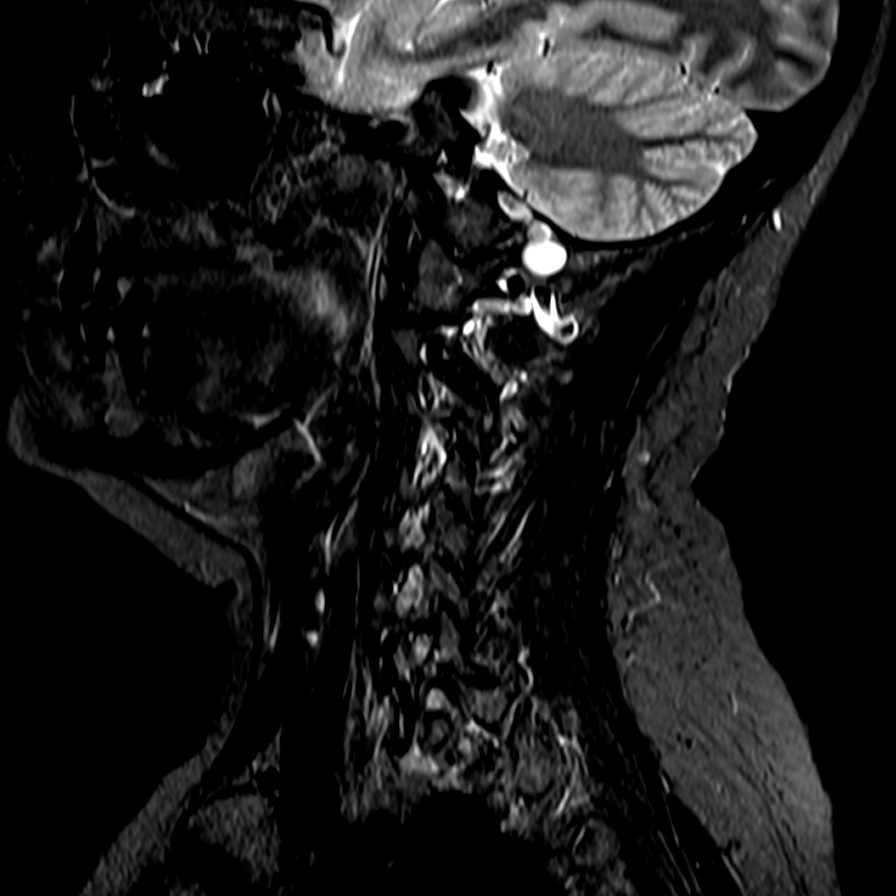

[Series 7: T2 · axial · 3.0mm · 0.17mm/px · z∈[-17,+66]mm · 3 of 36 slices shown (2 of 2)]
[im 5/36]
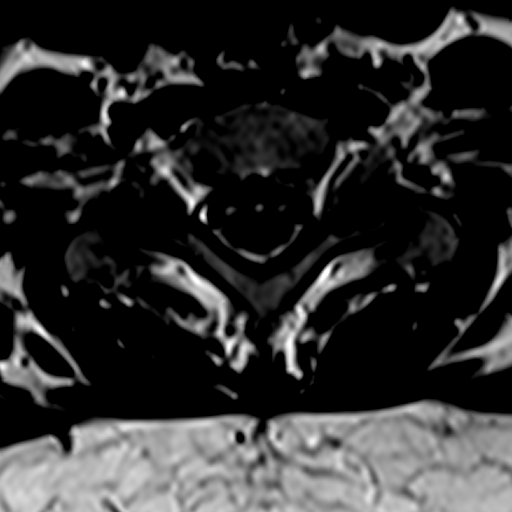
[im 19/36]
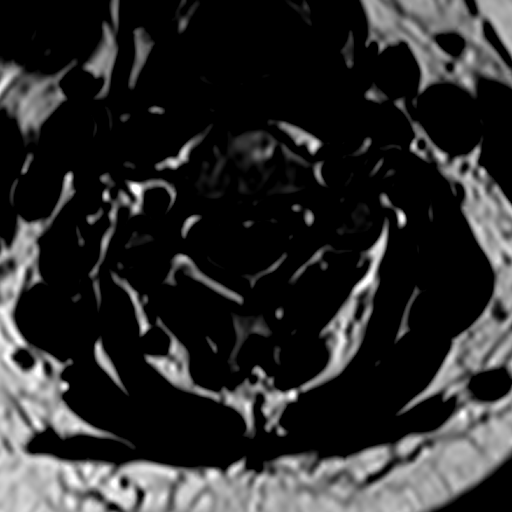
[im 31/36]
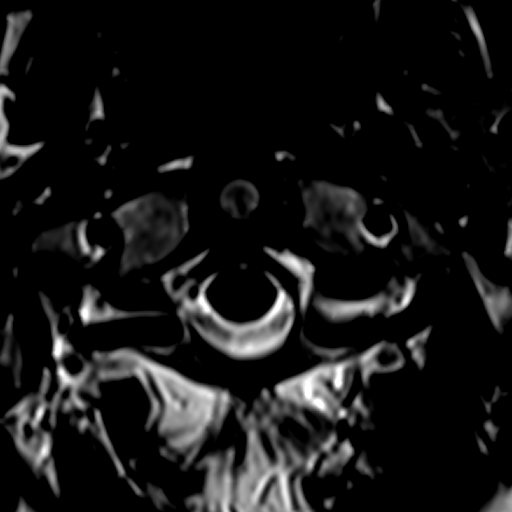

[14 of 48 positions shown; findings below may reference images not displayed]

FINDINGS: The craniocervical junction appears unremarkable. There is 1.5 mm
degenerative anterolisthesis at C2-3. Type 2 degenerative endplate
findings at C3-4.

Disc desiccation noted throughout cervical spine with loss of disc
height especially at C4-5, C5-6, and C6-7.

No significant abnormal spinal cord signal is observed. No
significant vertebral marrow edema is identified. Additional
findings at individual levels are as follows:

C2-3:  Unremarkable.

C3-4: Mild right foraminal stenosis and borderline central narrowing
of the thecal sac due to uncinate spurring and disc bulge.

C4-5: Mild bilateral foraminal stenosis and borderline central
narrowing of the thecal sac due to uncinate spurring and disc bulge.

C5-6: Moderate bilateral foraminal stenosis and mild right eccentric
central narrowing of the thecal sac due to intervertebral spurring
and disc bulge.

C6-7: Mild bilateral foraminal stenosis and borderline central
narrowing of the thecal sac due to disc bulge.

C7-T1:  Unremarkable.
IMPRESSION: 1. Cervical spondylosis and degenerative disc disease, causing
moderate impingement at C5-6 and mild impingement at C3-4, C4-5, and
C6-7, as detailed above.

## 2017-07-01 ENCOUNTER — Ambulatory Visit: Payer: BLUE CROSS/BLUE SHIELD | Admitting: Pediatrics

## 2017-07-01 ENCOUNTER — Encounter: Payer: Self-pay | Admitting: Pediatrics

## 2017-07-01 VITALS — BP 134/89 | HR 92 | Temp 98.8°F | Ht 61.0 in | Wt 127.0 lb

## 2017-07-01 DIAGNOSIS — Z0000 Encounter for general adult medical examination without abnormal findings: Secondary | ICD-10-CM

## 2017-07-01 DIAGNOSIS — R748 Abnormal levels of other serum enzymes: Secondary | ICD-10-CM

## 2017-07-01 DIAGNOSIS — E785 Hyperlipidemia, unspecified: Secondary | ICD-10-CM

## 2017-07-01 MED ORDER — ATORVASTATIN CALCIUM 80 MG PO TABS
ORAL_TABLET | ORAL | 3 refills | Status: DC
Start: 2017-07-01 — End: 2018-07-20

## 2017-07-01 NOTE — Progress Notes (Signed)
  Subjective:   Patient ID: Taylor Craig, female    DOB: 10/23/1955, 62 y.o.   MRN: [id number] CC: annual exam HPI: Taylor Craig is a 62 y.o. female   Walking regularly, eating more fruit/veg, less carbohydrate. Has been working toward weight loss. Is pleased with progress. No SOB or CP with exercise or exertion. Feeling well overall.  HLD: tolerating statin  Relevant past medical, surgical, family and social history reviewed. Allergies and medications reviewed and updated. Social History   Tobacco Use  Smoking Status Never Smoker  Smokeless Tobacco Never Used   ROS: All systems neg other than what is in the HPI  Objective:    BP 134/89   Pulse 92   Temp 98.8 F (37.1 C) (Oral)   Ht '5\' 1"'$  (1.549 m)   Wt 127 lb (57.6 kg)   BMI 24.00 kg/m   Wt Readings from Last 3 Encounters:  07/01/17 127 lb (57.6 kg)  08/25/16 153 lb (69.4 kg)  07/27/16 154 lb 9.6 oz (70.1 kg)    Gen: NAD, alert, cooperative with exam, NCAT EYES: EOMI, no conjunctival injection, or no icterus ENT:  TMs pearly gray b/l, OP without erythema LYMPH: no cervical LAD CV: NRRR, normal S1/S2, no murmur, distal pulses 2+ b/l Resp: CTABL, no wheezes, normal WOB Abd: +BS, soft, NTND. no guarding or organomegaly Ext: No edema, warm Neuro: Alert and oriented  Assessment & Plan:  Taylor Craig was seen today for annual visit.  Diagnoses and all orders for this visit:  Encounter for preventive care -     Fecal occult blood, imunochemical; Future  Elevated liver enzymes -     CMP14+EGFR  Hyperlipidemia, unspecified hyperlipidemia type Stable, cont below -     atorvastatin (LIPITOR) 80 MG tablet; TAKE 1 TABLET BY MOUTH EVERY DAY IN THE EVENING   Follow up plan: Return in about 1 year (around 07/02/2018). Taylor Found, MD [place]

## 2017-07-02 LAB — CMP14+EGFR
ALBUMIN: 4.9 g/dL — AB (ref 3.6–4.8)
ALK PHOS: 61 IU/L (ref 39–117)
ALT: 39 IU/L — ABNORMAL HIGH (ref 0–32)
AST: 33 IU/L (ref 0–40)
Albumin/Globulin Ratio: 2 (ref 1.2–2.2)
BUN / CREAT RATIO: 14 (ref 12–28)
BUN: 13 mg/dL (ref 8–27)
Bilirubin Total: 0.3 mg/dL (ref 0.0–1.2)
CO2: 24 mmol/L (ref 20–29)
CREATININE: 0.93 mg/dL (ref 0.57–1.00)
Calcium: 10.5 mg/dL — ABNORMAL HIGH (ref 8.7–10.3)
Chloride: 102 mmol/L (ref 96–106)
GFR calc non Af Amer: 66 mL/min/{1.73_m2} (ref >59)
GFR, EST AFRICAN AMERICAN: 76 mL/min/{1.73_m2} (ref >59)
GLUCOSE: 84 mg/dL (ref 65–99)
Globulin, Total: 2.4 g/dL (ref 1.5–4.5)
Potassium: 4.5 mmol/L (ref 3.5–5.2)
Sodium: 143 mmol/L (ref 134–144)
TOTAL PROTEIN: 7.3 g/dL (ref 6.0–8.5)

## 2017-09-22 ENCOUNTER — Encounter: Payer: Self-pay | Admitting: Family

## 2017-09-22 ENCOUNTER — Ambulatory Visit: Payer: BLUE CROSS/BLUE SHIELD | Admitting: Family

## 2017-09-22 VITALS — BP 144/85 | HR 91 | Temp 98.7°F | Ht 61.0 in | Wt 126.2 lb

## 2017-09-22 DIAGNOSIS — W57XXXA Bitten or stung by nonvenomous insect and other nonvenomous arthropods, initial encounter: Secondary | ICD-10-CM

## 2017-09-22 DIAGNOSIS — S30861A Insect bite (nonvenomous) of abdominal wall, initial encounter: Secondary | ICD-10-CM | POA: Diagnosis not present

## 2017-09-22 MED ORDER — DOXYCYCLINE HYCLATE 100 MG PO TABS: 100.0000 mg | ORAL_TABLET | Freq: Two times a day (BID) | ORAL | 0 refills | Status: DC

## 2017-09-22 NOTE — Progress Notes (Signed)
   Subjective:    Patient ID: Taylor Craig, female    DOB: [birth year], 62 y.o.   MRN: [id number]  Chief Complaint  Patient presents with  . Insect Bite   PT states she about two weeks ago she noticed a "itchy spot" on her right side and thought it was a mosquito bite. However, two days ago she noticed the same area red and continues to itch. She denies any fever, joint pain, headache, or flu like symptoms.  Rash  This is a new problem. The current episode started 1 to 4 weeks ago. The problem has been gradually worsening since onset. The affected locations include the abdomen. The rash is characterized by redness and itchiness. She was exposed to an insect bite/sting. Pertinent negatives include no congestion, cough, eye pain, fever, shortness of breath or sore throat. Past treatments include anti-itch cream. The treatment provided mild relief.      Review of Systems  Constitutional: Negative for fever.  HENT: Negative for congestion and sore throat.   Eyes: Negative for pain.  Respiratory: Negative for cough and shortness of breath.   Skin: Positive for rash.  All other systems reviewed and are negative.      Objective:   Physical Exam  Constitutional: She is oriented to person, place, and time. She appears well-developed and well-nourished. No distress.  HENT:  Head: Normocephalic and atraumatic.  Eyes: Pupils are equal, round, and reactive to light.  Neck: Normal range of motion. Neck supple. No thyromegaly present.  Cardiovascular: Normal rate, regular rhythm, normal heart sounds and intact distal pulses.  No murmur heard. Pulmonary/Chest: Effort normal and breath sounds normal. No respiratory distress. She has no wheezes.  Abdominal: Soft. Bowel sounds are normal. She exhibits no distension. There is no tenderness.  Musculoskeletal: Normal range of motion. She exhibits no edema or tenderness.  Neurological: She is alert and oriented to person, place, and time. She has  normal reflexes. No cranial nerve deficit.  Skin: Skin is warm and dry. Rash noted. Rash is macular (erythemas 9X7cm on right side).  Psychiatric: She has a normal mood and affect. Her behavior is normal. Judgment and thought content normal.  Vitals reviewed.       BP (!) 144/85   Pulse 91   Temp 98.7 F (37.1 C) (Oral)   Ht 5\' 1"  (1.549 m)   Wt 126 lb 3.2 oz (57.2 kg)   BMI 23.85 kg/m      Assessment & Plan:  Taylor Craig comes in today with chief complaint of Insect Bite   Diagnosis and orders addressed:  1. Insect bite of abdominal wall, initial encounter -Pt to report any new fever, joint pain, or rash -Wear protective clothing while outside- Long sleeves and long pants -Put insect repellent on all exposed skin and along clothing -Take a shower as soon as possible after being outside -RTO if symptoms worsen or do not improve - doxycycline (VIBRA-TABS) 100 MG tablet; Take 1 tablet (100 mg total) by mouth 2 (two) times daily.  Dispense: 20 tablet; Refill: 0 - [place] mtn spotted fvr abs pnl(IgG+IgM) - Lyme Ab/Western Blot Reflex   Taylor Dun, FNP

## 2017-09-22 NOTE — Patient Instructions (Signed)

## 2017-09-24 LAB — LYME AB/WESTERN BLOT REFLEX: LYME DISEASE AB, QUANT, IGM: <0.8 index (ref 0.00–0.79)

## 2017-09-24 LAB — ROCKY MTN SPOTTED FVR ABS PNL(IGG+IGM)
RMSF IGM: 0.44 {index} (ref 0.00–0.89)
RMSF IgG: NEGATIVE

## 2017-09-28 ENCOUNTER — Telehealth: Payer: Self-pay | Admitting: Pediatrics

## 2017-09-28 NOTE — Telephone Encounter (Signed)
Pt aware of labs  

## 2017-10-28 ENCOUNTER — Ambulatory Visit (INDEPENDENT_AMBULATORY_CARE_PROVIDER_SITE_OTHER): Payer: BLUE CROSS/BLUE SHIELD | Admitting: *Deleted

## 2017-10-28 DIAGNOSIS — Z23 Encounter for immunization: Secondary | ICD-10-CM

## 2017-10-28 NOTE — Progress Notes (Signed)
Flu shot given and patient tolerated well.  

## 2018-01-24 ENCOUNTER — Telehealth: Payer: Self-pay | Admitting: Pediatrics

## 2018-01-24 DIAGNOSIS — G8929 Other chronic pain: Secondary | ICD-10-CM

## 2018-01-24 DIAGNOSIS — M545 Low back pain, unspecified: Secondary | ICD-10-CM

## 2018-01-24 NOTE — Telephone Encounter (Signed)
Please address referral

## 2018-02-01 ENCOUNTER — Telehealth: Payer: Self-pay | Admitting: Pediatrics

## 2018-02-01 NOTE — Telephone Encounter (Signed)
PT has called we originally sent referral to [facility], her insurance is not in network, pt called insurance and [place] [facility] [facility] is and they also do injections, can we please send referral to them???

## 2018-02-02 NOTE — Telephone Encounter (Signed)
Referral changed to [facility]

## 2018-02-16 ENCOUNTER — Encounter (INDEPENDENT_AMBULATORY_CARE_PROVIDER_SITE_OTHER): Payer: Self-pay | Admitting: Physical Medicine and Rehabilitation

## 2018-02-16 ENCOUNTER — Ambulatory Visit (INDEPENDENT_AMBULATORY_CARE_PROVIDER_SITE_OTHER): Payer: PRIVATE HEALTH INSURANCE | Admitting: Physical Medicine and Rehabilitation

## 2018-02-16 VITALS — BP 160/91 | HR 74 | Ht 61.0 in | Wt 125.0 lb

## 2018-02-16 DIAGNOSIS — R519 Headache, unspecified: Secondary | ICD-10-CM

## 2018-02-16 DIAGNOSIS — M5412 Radiculopathy, cervical region: Secondary | ICD-10-CM | POA: Diagnosis not present

## 2018-02-16 DIAGNOSIS — M542 Cervicalgia: Secondary | ICD-10-CM

## 2018-02-16 DIAGNOSIS — R51 Headache: Secondary | ICD-10-CM | POA: Diagnosis not present

## 2018-02-16 NOTE — Progress Notes (Signed)
 .  Numeric Pain Rating Scale and Functional Assessment Average Pain 9 Pain Right Now 5 My pain is intermittent, dull and aching Pain is worse with: bending and some activites Pain improves with: injections   In the last MONTH (on 0-10 scale) has pain interfered with the following?  1. General activity like being  able to carry out your everyday physical activities such as walking, climbing stairs, carrying groceries, or moving a chair?  Rating(7)  2. Relation with others like being able to carry out your usual social activities and roles such as  activities at home, at work and in your community. Rating(7)  3. Enjoyment of life such that you have  been bothered by emotional problems such as feeling anxious, depressed or irritable?  Rating(4)

## 2018-04-22 ENCOUNTER — Telehealth (INDEPENDENT_AMBULATORY_CARE_PROVIDER_SITE_OTHER): Payer: Self-pay | Admitting: *Deleted

## 2018-04-25 ENCOUNTER — Encounter (INDEPENDENT_AMBULATORY_CARE_PROVIDER_SITE_OTHER): Payer: Self-pay | Admitting: Physical Medicine and Rehabilitation

## 2018-04-25 NOTE — Telephone Encounter (Signed)
Cervical C7-T1 interlam esi

## 2018-04-25 NOTE — Telephone Encounter (Signed)
Pt is scheduled for 06/01/2018 at 1p with driver.   Called pt insurance at [phone] and call center for pa has been closed due to covid-19, will try for pa again.

## 2018-04-25 NOTE — Progress Notes (Signed)
Taylor Craig - 62 y.o. female MRN [id number]  Date of birth: 1955-03-22  Office Visit Note: Visit Date: 02/16/2018 PCP: Eustaquio Maize, MD (Inactive) Referred by: Eustaquio Maize, MD  Subjective: Chief Complaint  Patient presents with   Head - Pain   Neck - Pain   Right Shoulder - Pain   Left Shoulder - Pain   Right Arm - Pain   Left Arm - Pain   HPI: Taylor Craig is a 63 y.o. female who comes in today At the request of Dr. Assunta Found her primary care physician for evaluation and management of chronic neck pain with history of headache and bilateral referral pattern to the right and left shoulder and shoulder blade and arm.  She is did endorse some paresthesias at times.  She reports ongoing symptoms for 3 to 4 years.  She does not endorse any specific trauma that is associated with the start of the symptoms.  Initially she was managed with anti-inflammatories and activity modification and some exercises.  She did have some physical therapy and other medication management.  Symptoms progressively worsened intermittently.  [facility]  She reports seeing Dr. Ike Bene who is a physiatrist  there and did perform some injections that were helpful.  Unfortunately we do not have their notes to review but it sounds like this was probably a combination of epidural and may be trigger point injection.  She has endorsed having had some dry needling with therapy.  She denies any focal weakness.  Headache seems to be more occipital.  She had does not carry a diagnosis of migraine headache.  She has had a history of melanoma.  This was not malignant melanoma.  She also has a history of vertigo.  MRI of the cervical spine was last performed in 2016 and this is reviewed with the patient today with images and spine model.  She was happy with her care with that group but changed insurances and they were out of network.  She is here today to establish care  with our facility if possible.  Her symptoms are worse with lifting bending and holding her head back.  She does get some relief with medication prior injection as well as exercises.  Review of Systems  Constitutional: Negative for chills, fever, malaise/fatigue and weight loss.  HENT: Negative for hearing loss and sinus pain.   Eyes: Negative for blurred vision, double vision and photophobia.  Respiratory: Negative for cough and shortness of breath.   Cardiovascular: Negative for chest pain, palpitations and leg swelling.  Gastrointestinal: Negative for abdominal pain, nausea and vomiting.  Genitourinary: Negative for flank pain.  Musculoskeletal: Positive for neck pain. Negative for myalgias.  Skin: Negative for itching and rash.  Neurological: Positive for tingling. Negative for tremors, focal weakness and weakness.       Intermittent headache  Endo/Heme/Allergies: Negative.   Psychiatric/Behavioral: Negative for depression.  All other systems reviewed and are negative.  Otherwise per HPI.  Assessment & Plan: Visit Diagnoses:  1. Nonintractable headache, unspecified chronicity pattern, unspecified headache type   2. Cervicalgia   3. Cervical radiculopathy     Plan: Findings:  Chronic 3 to 4-year history of neck pain rating to the shoulders bilaterally with some occipital type headache.  I think her symptoms are a combination of myofascial pain syndrome as well as osteoarthritis of the cervical spine causing some foraminal narrowing particular at C5-6.  There are degenerative changes  with some disc height loss but as we know degenerative disc disease is not really a disease and I tried to explain that to her.  We do see degenerative changes as we get older.  I think she is probably having some symptoms however from the level at C5-6 which would fit with her arms and shoulder blade symptoms.  Epidural injection in the past seem to be beneficial.  Would consider regrouping with a different  physical therapist for dry needling or we could look a trigger point injection.  Right now the patient is doing okay we will continue with her current level of exercises and activity modification.  We will see her back as needed for possible epidural injection.    Meds & Orders: No orders of the defined types were placed in this encounter.  No orders of the defined types were placed in this encounter.   Follow-up: Return if symptoms worsen or fail to improve.   Procedures: No procedures performed  No notes on file   Clinical History: MRI CERVICAL SPINE WITHOUT CONTRAST  TECHNIQUE: Multiplanar, multisequence MR imaging of the cervical spine was performed. No intravenous contrast was administered.  COMPARISON:  07/16/2014  FINDINGS: The craniocervical junction appears unremarkable. There is 1.5 mm degenerative anterolisthesis at C2-3. Type 2 degenerative endplate findings at O5-3.  Disc desiccation noted throughout cervical spine with loss of disc height especially at C4-5, C5-6, and C6-7.  No significant abnormal spinal cord signal is observed. No significant vertebral marrow edema is identified. Additional findings at individual levels are as follows:  C2-3:  Unremarkable.  C3-4: Mild right foraminal stenosis and borderline central narrowing of the thecal sac due to uncinate spurring and disc bulge.  C4-5: Mild bilateral foraminal stenosis and borderline central narrowing of the thecal sac due to uncinate spurring and disc bulge.  C5-6: Moderate bilateral foraminal stenosis and mild right eccentric central narrowing of the thecal sac due to intervertebral spurring and disc bulge.  C6-7: Mild bilateral foraminal stenosis and borderline central narrowing of the thecal sac due to disc bulge.  C7-T1:  Unremarkable.  IMPRESSION: 1. Cervical spondylosis and degenerative disc disease, causing moderate impingement at C5-6 and mild impingement at C3-4, C4-5,  and C6-7, as detailed above.   Electronically Signed   By: Van Clines M.D.   On: 07/16/2014 14:35   She reports that she has never smoked. She has never used smokeless tobacco. No results for input(s): HGBA1C, LABURIC in the last 8760 hours.  Objective:  VS:  HT:5\' 1"  (154.9 cm)    WT:125 lb (56.7 kg)   BMI:23.63     BP:(!) 160/91   HR:74bpm   TEMP: ( )   RESP:  Physical Exam Vitals signs and nursing note reviewed.  Constitutional:      General: She is not in acute distress.    Appearance: Normal appearance. She is well-developed.  HENT:     Head: Normocephalic and atraumatic.     Nose: Nose normal.     Mouth/Throat:     Mouth: Mucous membranes are moist.     Pharynx: Oropharynx is clear.  Eyes:     Conjunctiva/sclera: Conjunctivae normal.     Pupils: Pupils are equal, round, and reactive to light.  Neck:     Musculoskeletal: Normal range of motion and neck supple.  Cardiovascular:     Rate and Rhythm: Regular rhythm.  Pulmonary:     Effort: Pulmonary effort is normal. No respiratory distress.  Abdominal:  General: There is no distension.     Palpations: Abdomen is soft.     Tenderness: There is no guarding.  Musculoskeletal:     Right lower leg: No edema.     Left lower leg: No edema.     Comments: Patient sits with forward flexed cervical spine she does have some pain at end ranges of rotation right and left.  She has more pain with extension and forward flexion.  She has a negative Spurling's test bilaterally.  There are active trigger points in the levator scapula and trapezius and rhomboid that do reproduce some of her pain.  She has really no impingement signs of the shoulders bilaterally.  She has good strength in the upper extremities bilaterally and symmetric.  Good reflexes at the biceps and brachioradialis symmetric bilaterally.  Negative Hoffmann's test.  Skin:    General: Skin is warm and dry.     Findings: No erythema or rash.  Neurological:      General: No focal deficit present.     Mental Status: She is alert and oriented to person, place, and time.     Sensory: No sensory deficit.     Motor: No abnormal muscle tone.     Coordination: Coordination normal.     Gait: Gait normal.     Deep Tendon Reflexes: Reflexes normal.  Psychiatric:        Mood and Affect: Mood normal.        Behavior: Behavior normal.        Thought Content: Thought content normal.     Ortho Exam Imaging: No results found.  Past Medical/Family/Surgical/Social History: Medications & Allergies reviewed per EMR, new medications updated. Patient Active Problem List   Diagnosis Date Noted   H/O malignant melanoma of skin 07/27/2016   Vertigo 04/01/2015   Tongue fasciculation 03/14/2015   Episodic lightheadedness 03/14/2015   Cephalalgia 03/14/2015   White coat syndrome with hypertension 03/14/2015   Osteoarthritis of neck 09/25/2014   BMI 30.0-30.9,adult 09/25/2014   Hyperlipidemia 09/25/2014   Elevated liver enzymes 07/20/2013   Past Medical History:  Diagnosis Date   Cancer ([place]) 2004   melanoma    Hyperlipemia    History reviewed. No pertinent family history. Past Surgical History:  Procedure Laterality Date   ABDOMINAL HYSTERECTOMY     SKIN CANCER EXCISION     Social History   Occupational History   Not on file  Tobacco Use   Smoking status: Never Smoker   Smokeless tobacco: Never Used  Substance and Sexual Activity   Alcohol use: No   Drug use: No   Sexual activity: Yes

## 2018-06-01 ENCOUNTER — Encounter (INDEPENDENT_AMBULATORY_CARE_PROVIDER_SITE_OTHER): Payer: Self-pay | Admitting: Physical Medicine and Rehabilitation

## 2018-06-07 ENCOUNTER — Telehealth: Payer: Self-pay | Admitting: *Deleted

## 2018-06-07 NOTE — Telephone Encounter (Signed)
Submitted PA for patient to ambetter to insurance, case is pending.

## 2018-06-15 ENCOUNTER — Ambulatory Visit: Payer: Self-pay

## 2018-06-15 ENCOUNTER — Encounter: Payer: Self-pay | Admitting: Physical Medicine and Rehabilitation

## 2018-06-15 ENCOUNTER — Other Ambulatory Visit: Payer: Self-pay

## 2018-06-15 ENCOUNTER — Ambulatory Visit: Payer: PRIVATE HEALTH INSURANCE | Admitting: Physical Medicine and Rehabilitation

## 2018-06-15 VITALS — BP 156/78 | HR 80

## 2018-06-15 DIAGNOSIS — M5412 Radiculopathy, cervical region: Secondary | ICD-10-CM

## 2018-06-15 MED ORDER — METHYLPREDNISOLONE ACETATE 80 MG/ML IJ SUSP: 80.0000 mg | Freq: Once | INTRAMUSCULAR | Status: DC

## 2018-06-15 NOTE — Progress Notes (Signed)
.  Numeric Pain Rating Scale and Functional Assessment Average Pain 6   In the last MONTH (on 0-10 scale) has pain interfered with the following?  1. General activity like being  able to carry out your everyday physical activities such as walking, climbing stairs, carrying groceries, or moving a chair?  Rating(5)   +Driver, -BT, -Dye Allergies.  

## 2018-06-15 NOTE — Procedures (Signed)
Cervical Epidural Steroid Injection - Interlaminar Approach with Fluoroscopic Guidance  Patient: Taylor Craig      Date of Birth: 02/26/1955 MRN: [id number] PCP: Sharion Balloon, FNP      Visit Date: 06/15/2018   Universal Protocol:    Date/Time: 05/20/201:47 PM  Consent Given By: the patient  Position: PRONE  Additional Comments: Vital signs were monitored before and after the procedure. Patient was prepped and draped in the usual sterile fashion. The correct patient, procedure, and site was verified.   Injection Procedure Details:  Procedure Site One Meds Administered:  Meds ordered this encounter  Medications  . methylPREDNISolone acetate (DEPO-MEDROL) injection 80 mg     Laterality: Left  Location/Site: C7-T1  Needle size: 20 G  Needle type: Touhy  Needle Placement: Paramedian epidural space  Findings:  -Comments: Excellent flow of contrast into the epidural space.  Patient really does have a fairly tight interlaminar space on AP imaging of the cervical spine.  Even with increased chin tuck and positioning on cervical board continued to have very tight interlaminar positioning.  We ultimately did get good flow contrast that showed good epidural flow really more left than midline.  In the future would look at T1-2 injection if this was very beneficial.  Procedure Details: Using a paramedian approach from the side mentioned above, the region overlying the inferior lamina was localized under fluoroscopic visualization and the soft tissues overlying this structure were infiltrated with 4 ml. of 1% Lidocaine without Epinephrine. A # 20 gauge, Tuohy needle was inserted into the epidural space using a paramedian approach.  The epidural space was localized using loss of resistance along with lateral and contralateral oblique bi-planar fluoroscopic views.  After negative aspirate for air, blood, and CSF, a 2 ml. volume of Isovue-250 was injected into the epidural space and  the flow of contrast was observed. Radiographs were obtained for documentation purposes.   The injectate was administered into the level noted above.  Additional Comments:  The patient tolerated the procedure well Dressing: 2 x 2 sterile gauze and Band-Aid    Post-procedure details: Patient was observed during the procedure. Post-procedure instructions were reviewed.  Patient left the clinic in stable condition.

## 2018-06-15 NOTE — Progress Notes (Signed)
Taylor Craig - 63 y.o. female MRN [id number]  Date of birth: [birth year]  Office Visit Note: Visit Date: 06/15/2018 PCP: Sharion Balloon, FNP Referred by: No ref. provider found  Subjective: Chief Complaint  Patient presents with  . Head - Pain  . Neck - Pain  . Right Shoulder - Pain  . Left Shoulder - Pain   HPI:  Taylor Craig is a 63 y.o. female who comes in today For planned C7-T1 interlaminar epidural steroid injection.  There was little bit of confusion today because it is been such a delay in seeing her back from when I first saw her.  I only seen on one occasion as a new patient.  Insurance changes.  She was seeing Dr. Zonia Kief and had received trigger point injections as well as interlaminar epidural injection.  We did schedule her for the epidural injection sometime ago.  She felt like she was coming in today for the trigger point injections.  We talked it over and decided to go ahead and complete the cervical epidural injection as I think she may be getting nerve root irritation causing a lot of myofascial type pain in the mid back and neck and shoulders.  She does have myofascial pain as well.  Please see our prior evaluation and management note for further details and justification.  ROS Otherwise per HPI.  Assessment & Plan: Visit Diagnoses:  1. Cervical radiculopathy     Plan: No additional findings.   Meds & Orders:  Meds ordered this encounter  Medications  . methylPREDNISolone acetate (DEPO-MEDROL) injection 80 mg    Orders Placed This Encounter  Procedures  . XR C-ARM NO REPORT  . Epidural Steroid injection    Follow-up: No follow-ups on file.   Procedures: No procedures performed  Cervical Epidural Steroid Injection - Interlaminar Approach with Fluoroscopic Guidance  Patient: Taylor Craig      Date of Birth: 1955-05-29 MRN: [id number] PCP: Sharion Balloon, FNP      Visit Date: 06/15/2018   Universal Protocol:    Date/Time:  05/20/201:47 PM  Consent Given By: the patient  Position: PRONE  Additional Comments: Vital signs were monitored before and after the procedure. Patient was prepped and draped in the usual sterile fashion. The correct patient, procedure, and site was verified.   Injection Procedure Details:  Procedure Site One Meds Administered:  Meds ordered this encounter  Medications  . methylPREDNISolone acetate (DEPO-MEDROL) injection 80 mg     Laterality: Left  Location/Site: C7-T1  Needle size: 20 G  Needle type: Touhy  Needle Placement: Paramedian epidural space  Findings:  -Comments: Excellent flow of contrast into the epidural space.  Patient really does have a fairly tight interlaminar space on AP imaging of the cervical spine.  Even with increased chin tuck and positioning on cervical board continued to have very tight interlaminar positioning.  We ultimately did get good flow contrast that showed good epidural flow really more left than midline.  In the future would look at T1-2 injection if this was very beneficial.  Procedure Details: Using a paramedian approach from the side mentioned above, the region overlying the inferior lamina was localized under fluoroscopic visualization and the soft tissues overlying this structure were infiltrated with 4 ml. of 1% Lidocaine without Epinephrine. A # 20 gauge, Tuohy needle was inserted into the epidural space using a paramedian approach.  The epidural space was localized using loss of resistance along with lateral and contralateral oblique  bi-planar fluoroscopic views.  After negative aspirate for air, blood, and CSF, a 2 ml. volume of Isovue-250 was injected into the epidural space and the flow of contrast was observed. Radiographs were obtained for documentation purposes.   The injectate was administered into the level noted above.  Additional Comments:  The patient tolerated the procedure well Dressing: 2 x 2 sterile gauze and  Band-Aid    Post-procedure details: Patient was observed during the procedure. Post-procedure instructions were reviewed.  Patient left the clinic in stable condition.    Clinical History: MRI CERVICAL SPINE WITHOUT CONTRAST  TECHNIQUE: Multiplanar, multisequence MR imaging of the cervical spine was performed. No intravenous contrast was administered.  COMPARISON:  07/16/2014  FINDINGS: The craniocervical junction appears unremarkable. There is 1.5 mm degenerative anterolisthesis at C2-3. Type 2 degenerative endplate findings at X3-2.  Disc desiccation noted throughout cervical spine with loss of disc height especially at C4-5, C5-6, and C6-7.  No significant abnormal spinal cord signal is observed. No significant vertebral marrow edema is identified. Additional findings at individual levels are as follows:  C2-3:  Unremarkable.  C3-4: Mild right foraminal stenosis and borderline central narrowing of the thecal sac due to uncinate spurring and disc bulge.  C4-5: Mild bilateral foraminal stenosis and borderline central narrowing of the thecal sac due to uncinate spurring and disc bulge.  C5-6: Moderate bilateral foraminal stenosis and mild right eccentric central narrowing of the thecal sac due to intervertebral spurring and disc bulge.  C6-7: Mild bilateral foraminal stenosis and borderline central narrowing of the thecal sac due to disc bulge.  C7-T1:  Unremarkable.  IMPRESSION: 1. Cervical spondylosis and degenerative disc disease, causing moderate impingement at C5-6 and mild impingement at C3-4, C4-5, and C6-7, as detailed above.   Electronically Signed   By: Van Clines M.D.   On: 07/16/2014 14:35     Objective:  VS:  HT:    WT:   BMI:     BP:(!) 156/78  HR:80bpm  TEMP: ( )  RESP:  Physical Exam  Ortho Exam Imaging: No results found.

## 2018-06-23 ENCOUNTER — Other Ambulatory Visit: Payer: Self-pay | Admitting: Family Medicine

## 2018-06-23 DIAGNOSIS — Z1231 Encounter for screening mammogram for malignant neoplasm of breast: Secondary | ICD-10-CM

## 2018-06-26 ENCOUNTER — Telehealth: Payer: Self-pay | Admitting: Cardiology

## 2018-06-26 DIAGNOSIS — Z20822 Contact with and (suspected) exposure to covid-19: Secondary | ICD-10-CM

## 2018-06-26 NOTE — Telephone Encounter (Signed)
Spoke with patient regarding recent office visit 5/20 and possible Covid exposure.  Covid ordered, appt 6/1 Monday @ 1:30

## 2018-06-27 ENCOUNTER — Other Ambulatory Visit: Payer: Self-pay

## 2018-06-27 DIAGNOSIS — Z20822 Contact with and (suspected) exposure to covid-19: Secondary | ICD-10-CM

## 2018-06-28 LAB — NOVEL CORONAVIRUS, NAA: SARS-CoV-2, NAA: NOT DETECTED

## 2018-07-19 ENCOUNTER — Other Ambulatory Visit: Payer: Self-pay

## 2018-07-20 ENCOUNTER — Encounter: Payer: Self-pay | Admitting: Family Medicine

## 2018-07-20 ENCOUNTER — Ambulatory Visit (INDEPENDENT_AMBULATORY_CARE_PROVIDER_SITE_OTHER): Payer: PRIVATE HEALTH INSURANCE | Admitting: Family Medicine

## 2018-07-20 VITALS — BP 135/83 | HR 98 | Temp 98.5°F | Ht 61.0 in | Wt 133.0 lb

## 2018-07-20 DIAGNOSIS — G2581 Restless legs syndrome: Secondary | ICD-10-CM | POA: Diagnosis not present

## 2018-07-20 DIAGNOSIS — R748 Abnormal levels of other serum enzymes: Secondary | ICD-10-CM

## 2018-07-20 DIAGNOSIS — N904 Leukoplakia of vulva: Secondary | ICD-10-CM | POA: Diagnosis not present

## 2018-07-20 DIAGNOSIS — E781 Pure hyperglyceridemia: Secondary | ICD-10-CM

## 2018-07-20 DIAGNOSIS — E785 Hyperlipidemia, unspecified: Secondary | ICD-10-CM

## 2018-07-20 DIAGNOSIS — Z1211 Encounter for screening for malignant neoplasm of colon: Secondary | ICD-10-CM

## 2018-07-20 DIAGNOSIS — Z0001 Encounter for general adult medical examination with abnormal findings: Secondary | ICD-10-CM

## 2018-07-20 DIAGNOSIS — Z114 Encounter for screening for human immunodeficiency virus [HIV]: Secondary | ICD-10-CM

## 2018-07-20 DIAGNOSIS — Z0000 Encounter for general adult medical examination without abnormal findings: Secondary | ICD-10-CM

## 2018-07-20 DIAGNOSIS — Z130 Encounter for screening for diseases of the blood and blood-forming organs and certain disorders involving the immune mechanism: Secondary | ICD-10-CM

## 2018-07-20 MED ORDER — ATORVASTATIN CALCIUM 80 MG PO TABS: ORAL_TABLET | ORAL | 3 refills | Status: DC

## 2018-07-20 MED ORDER — CLOBETASOL PROPIONATE 0.05 % EX CREA: TOPICAL_CREAM | CUTANEOUS | 2 refills | Status: DC

## 2018-07-20 NOTE — Patient Instructions (Addendum)
Lichen Sclerosus Lichen sclerosus is a skin problem. It can happen on any part of the body, but it commonly involves the anal or genital areas. It can cause itching and discomfort in these areas. Treatment can help to control symptoms. When the genital area is affected, getting treatment is important because the condition can cause scarring that may lead to other problems. What are the causes? The cause of this condition is not known. It may be related to an overactive immune system or a lack of certain hormones. Lichen sclerosus is not an infection or a fungus, and it is not passed from one person to another (not contagious). What increases the risk? This condition is more likely to develop in women, usually after menopause. What are the signs or symptoms? Symptoms of this condition include:  Thin, wrinkled, white areas on the skin.  Thickened white areas on the skin.  Red and swollen patches (lesions) on the skin.  Tears or cracks in the skin.  Bruising.  Blood blisters.  Severe itching.  Pain, itching, or burning when urinating. Constipation is also common in people with lichen sclerosus. How is this diagnosed? This condition may be diagnosed with a physical exam. In some cases, a tissue sample (biopsy sample) may be removed to be looked at under a microscope. How is this treated? This condition is usually treated with medicated creams or ointments (topical steroids) that are applied over the affected areas. In some cases, treatment may also include medicines that are taken by mouth. Surgery may be needed in more severe cases that are causing problems such as scarring. Follow these instructions at home:  Take or use over-the-counter and prescription medicines only as told by your health care provider.  Use creams or ointments as told by your health care provider.  Do not scratch the affected areas of skin.  If you are a woman, be sure to keep the vaginal area as clean and dry  as possible.  Clean the affected area of skin gently with water. Avoid using rough towels or toilet paper.  Keep all follow-up visits as told by your health care provider. This is important. Contact a health care provider if:  You have increasing redness, swelling, or pain in the affected area.  You have fluid, blood, or pus coming from the affected area.  You have new lesions on your skin.  You have a fever.  You have pain during sex. Summary  Lichen sclerosus is a skin problem. When the genital area is affected, getting treatment is important because the condition can cause scarring that may lead to other problems.  This condition is usually treated with medicated creams or ointments (topical steroids) that are applied over the affected areas.  Take or use over-the-counter and prescription medicines only as told by your health care provider.  Contact a health care provider if you have new lesions on your skin, have pain during sex, or have increasing redness, swelling, or pain in the affected area.  Keep all follow-up visits as told by your health care provider. This is important. This information is not intended to replace advice given to you by your health care provider. Make sure you discuss any questions you have with your health care provider. Document Released: 06/04/2010 Document Revised: 05/27/2017 Document Reviewed: 05/27/2017 Elsevier Interactive Patient Education  [employer].   .Health Maintenance, Female Adopting a healthy lifestyle and getting preventive care can go a long way to promote health and wellness. Talk with your  health care provider about what schedule of regular examinations is right for you. This is a good chance for you to check in with your provider about disease prevention and staying healthy. In between checkups, there are plenty of things you can do on your own. Experts have done a lot of research about which lifestyle changes and preventive  measures are most likely to keep you healthy. Ask your health care provider for more information. Weight and diet Eat a healthy diet  Be sure to include plenty of vegetables, fruits, low-fat dairy products, and lean protein.  Do not eat a lot of foods high in solid fats, added sugars, or salt.  Get regular exercise. This is one of the most important things you can do for your health. ? Most adults should exercise for at least 150 minutes each week. The exercise should increase your heart rate and make you sweat (moderate-intensity exercise). ? Most adults should also do strengthening exercises at least twice a week. This is in addition to the moderate-intensity exercise. Maintain a healthy weight  Body mass index (BMI) is a measurement that can be used to identify possible weight problems. It estimates body fat based on height and weight. Your health care provider can help determine your BMI and help you achieve or maintain a healthy weight.  For females 1 years of age and older: ? A BMI below 18.5 is considered underweight. ? A BMI of 18.5 to 24.9 is normal. ? A BMI of 25 to 29.9 is considered overweight. ? A BMI of 30 and above is considered obese. Watch levels of cholesterol and blood lipids  You should start having your blood tested for lipids and cholesterol at 64 years of age, then have this test every 5 years.  You may need to have your cholesterol levels checked more often if: ? Your lipid or cholesterol levels are high. ? You are older than 64 years of age. ? You are at high risk for heart disease. Cancer screening Lung Cancer  Lung cancer screening is recommended for adults 9-25 years old who are at high risk for lung cancer because of a history of smoking.  A yearly low-dose CT scan of the lungs is recommended for people who: ? Currently smoke. ? Have quit within the past 15 years. ? Have at least a 30-pack-year history of smoking. A pack year is smoking an average of  one pack of cigarettes a day for 1 year.  Yearly screening should continue until it has been 15 years since you quit.  Yearly screening should stop if you develop a health problem that would prevent you from having lung cancer treatment. Breast Cancer  Practice breast self-awareness. This means understanding how your breasts normally appear and feel.  It also means doing regular breast self-exams. Let your health care provider know about any changes, no matter how small.  If you are in your 20s or 30s, you should have a clinical breast exam (CBE) by a health care provider every 1-3 years as part of a regular health exam.  If you are 65 or older, have a CBE every year. Also consider having a breast X-ray (mammogram) every year.  If you have a family history of breast cancer, talk to your health care provider about genetic screening.  If you are at high risk for breast cancer, talk to your health care provider about having an MRI and a mammogram every year.  Breast cancer gene (BRCA) assessment is recommended for  women who have family members with BRCA-related cancers. BRCA-related cancers include: ? Breast. ? Ovarian. ? Tubal. ? Peritoneal cancers.  Results of the assessment will determine the need for genetic counseling and BRCA1 and BRCA2 testing. Cervical Cancer Your health care provider may recommend that you be screened regularly for cancer of the pelvic organs (ovaries, uterus, and vagina). This screening involves a pelvic examination, including checking for microscopic changes to the surface of your cervix (Pap test). You may be encouraged to have this screening done every 3 years, beginning at age 76.  For women ages 54-65, health care providers may recommend pelvic exams and Pap testing every 3 years, or they may recommend the Pap and pelvic exam, combined with testing for human papilloma virus (HPV), every 5 years. Some types of HPV increase your risk of cervical cancer. Testing  for HPV may also be done on women of any age with unclear Pap test results.  Other health care providers may not recommend any screening for nonpregnant women who are considered low risk for pelvic cancer and who do not have symptoms. Ask your health care provider if a screening pelvic exam is right for you.  If you have had past treatment for cervical cancer or a condition that could lead to cancer, you need Pap tests and screening for cancer for at least 20 years after your treatment. If Pap tests have been discontinued, your risk factors (such as having a new sexual partner) need to be reassessed to determine if screening should resume. Some women have medical problems that increase the chance of getting cervical cancer. In these cases, your health care provider may recommend more frequent screening and Pap tests. Colorectal Cancer  This type of cancer can be detected and often prevented.  Routine colorectal cancer screening usually begins at 64 years of age and continues through 64 years of age.  Your health care provider may recommend screening at an earlier age if you have risk factors for colon cancer.  Your health care provider may also recommend using home test kits to check for hidden blood in the stool.  A small camera at the end of a tube can be used to examine your colon directly (sigmoidoscopy or colonoscopy). This is done to check for the earliest forms of colorectal cancer.  Routine screening usually begins at age 32.  Direct examination of the colon should be repeated every 5-10 years through 64 years of age. However, you may need to be screened more often if early forms of precancerous polyps or small growths are found. Skin Cancer  Check your skin from head to toe regularly.  Tell your health care provider about any new moles or changes in moles, especially if there is a change in a mole's shape or color.  Also tell your health care provider if you have a mole that is  larger than the size of a pencil eraser.  Always use sunscreen. Apply sunscreen liberally and repeatedly throughout the day.  Protect yourself by wearing long sleeves, pants, a wide-brimmed hat, and sunglasses whenever you are outside. Heart disease, diabetes, and high blood pressure  High blood pressure causes heart disease and increases the risk of stroke. High blood pressure is more likely to develop in: ? People who have blood pressure in the high end of the normal range (130-139/85-89 mm Hg). ? People who are overweight or obese. ? People who are African American.  If you are 74-22 years of age, have your blood pressure  checked every 3-5 years. If you are 49 years of age or older, have your blood pressure checked every year. You should have your blood pressure measured twice-once when you are at a hospital or clinic, and once when you are not at a hospital or clinic. Record the average of the two measurements. To check your blood pressure when you are not at a hospital or clinic, you can use: ? An automated blood pressure machine at a pharmacy. ? A home blood pressure monitor.  If you are between 54 years and 73 years old, ask your health care provider if you should take aspirin to prevent strokes.  Have regular diabetes screenings. This involves taking a blood sample to check your fasting blood sugar level. ? If you are at a normal weight and have a low risk for diabetes, have this test once every three years after 64 years of age. ? If you are overweight and have a high risk for diabetes, consider being tested at a younger age or more often. Preventing infection Hepatitis B  If you have a higher risk for hepatitis B, you should be screened for this virus. You are considered at high risk for hepatitis B if: ? You were born in a country where hepatitis B is common. Ask your health care provider which countries are considered high risk. ? Your parents were born in a high-risk country,  and you have not been immunized against hepatitis B (hepatitis B vaccine). ? You have HIV or AIDS. ? You use needles to inject street drugs. ? You live with someone who has hepatitis B. ? You have had sex with someone who has hepatitis B. ? You get hemodialysis treatment. ? You take certain medicines for conditions, including cancer, organ transplantation, and autoimmune conditions. Hepatitis C  Blood testing is recommended for: ? Everyone born from [birth year] through 1966. ? Anyone with known risk factors for hepatitis C. Sexually transmitted infections (STIs)  You should be screened for sexually transmitted infections (STIs) including gonorrhea and chlamydia if: ? You are sexually active and are younger than 64 years of age. ? You are older than 64 years of age and your health care provider tells you that you are at risk for this type of infection. ? Your sexual activity has changed since you were last screened and you are at an increased risk for chlamydia or gonorrhea. Ask your health care provider if you are at risk.  If you do not have HIV, but are at risk, it may be recommended that you take a prescription medicine daily to prevent HIV infection. This is called pre-exposure prophylaxis (PrEP). You are considered at risk if: ? You are sexually active and do not regularly use condoms or know the HIV status of your partner(s). ? You take drugs by injection. ? You are sexually active with a partner who has HIV. Talk with your health care provider about whether you are at high risk of being infected with HIV. If you choose to begin PrEP, you should first be tested for HIV. You should then be tested every 3 months for as long as you are taking PrEP. Pregnancy  If you are premenopausal and you may become pregnant, ask your health care provider about preconception counseling.  If you may become pregnant, take 400 to 800 micrograms (mcg) of folic acid every day.  If you want to prevent  pregnancy, talk to your health care provider about birth control (contraception). Osteoporosis and menopause  Osteoporosis  is a disease in which the bones lose minerals and strength with aging. This can result in serious bone fractures. Your risk for osteoporosis can be identified using a bone density scan.  If you are 81 years of age or older, or if you are at risk for osteoporosis and fractures, ask your health care provider if you should be screened.  Ask your health care provider whether you should take a calcium or vitamin D supplement to lower your risk for osteoporosis.  Menopause may have certain physical symptoms and risks.  Hormone replacement therapy may reduce some of these symptoms and risks. Talk to your health care provider about whether hormone replacement therapy is right for you. Follow these instructions at home:  Schedule regular health, dental, and eye exams.  Stay current with your immunizations.  Do not use any tobacco products including cigarettes, chewing tobacco, or electronic cigarettes.  If you are pregnant, do not drink alcohol.  If you are breastfeeding, limit how much and how often you drink alcohol.  Limit alcohol intake to no more than 1 drink per day for nonpregnant women. One drink equals 12 ounces of beer, 5 ounces of wine, or 1 ounces of hard liquor.  Do not use street drugs.  Do not share needles.  Ask your health care provider for help if you need support or information about quitting drugs.  Tell your health care provider if you often feel depressed.  Tell your health care provider if you have ever been abused or do not feel safe at home. This information is not intended to replace advice given to you by your health care provider. Make sure you discuss any questions you have with your health care provider. Document Released: 07/28/2010 Document Revised: 06/20/2015 Document Reviewed: 10/16/2014 Elsevier Interactive Patient Education  2019  [employer].

## 2018-07-20 NOTE — Progress Notes (Signed)
Taylor Craig is a 63 y.o. female presents to office today for annual physical exam examination.    Concerns today include: 1. Vaginal irritation Patient reports several month history of intermittent vaginal irritation.  She describes this as irritation along the skin between her rectum and vagina.  Denies any substantial itching.  She notes that symptoms seem to be worse by a close rubbing on the area.  No abnormal vaginal discharge or bleeding.  Medical history significant for hysterectomy.  She is sexually active and denies any postcoital bleeding or dyspareunia  2.  Restless leg Patient reports several month history of intermittent restless legs.  She describes inability to calm her legs particularly at nighttime.  She feels the need to get up and move.  This does not occur every night.  Occupation: retired Hydrographic surveyor, Marital status: married, Substance use: none Diet: typical [place], Exercise: no structured due to neck pain Last eye exam: wears glasses Last colonoscopy: needs; has never had a colonoscopy.  She typically uses [facility]OB[facility].  Would be interested in Cologuard if her insurance pays for this. Last mammogram: 04/2017 Last pap smear: hx abdominal hysterectomy Refills needed today: lipitor Immunizations needed: UTD  Past Medical History:  Diagnosis Date  . Cancer ([place]) 2004   melanoma   . Hyperlipemia    Social History   Socioeconomic History  . Marital status: Married    Spouse name: Not on file  . Number of children: Not on file  . Years of education: Not on file  . Highest education level: Not on file  Occupational History  . Not on file  Social Needs  . Financial resource strain: Not on file  . Food insecurity    Worry: Not on file    Inability: Not on file  . Transportation needs    Medical: Not on file    Non-medical: Not on file  Tobacco Use  . Smoking status: Never Smoker  . Smokeless tobacco: Never Used  Substance and Sexual Activity  . Alcohol  use: No  . Drug use: No  . Sexual activity: Yes  Lifestyle  . Physical activity    Days per week: Not on file    Minutes per session: Not on file  . Stress: Not on file  Relationships  . Social Herbalist on phone: Not on file    Gets together: Not on file    Attends religious service: Not on file    Active member of club or organization: Not on file    Attends meetings of clubs or organizations: Not on file    Relationship status: Not on file  . Intimate partner violence    Fear of current or ex partner: Not on file    Emotionally abused: Not on file    Physically abused: Not on file    Forced sexual activity: Not on file  Other Topics Concern  . Not on file  Social History Narrative  . Not on file   Past Surgical History:  Procedure Laterality Date  . ABDOMINAL HYSTERECTOMY    . SKIN CANCER EXCISION     No family history on file.  Current Outpatient Medications:  .  atorvastatin (LIPITOR) 80 MG tablet, TAKE 1 TABLET BY MOUTH EVERY DAY IN THE EVENING, Disp: 90 tablet, Rfl: 3 .  Multiple Vitamin (MULTIVITAMIN WITH MINERALS) TABS tablet, Take 1 tablet by mouth daily., Disp: , Rfl:   No Known Allergies   ROS: Review of Systems  Constitutional: negative Eyes: positive for contacts/glasses Ears, nose, mouth, throat, and face: negative Respiratory: negative Cardiovascular: negative Gastrointestinal: negative Genitourinary:negative, as above. no abnormal vaginal discharge Integument/breast: negative Hematologic/lymphatic: negative Musculoskeletal:positive for chronic neck pain Neurological: restless legs as above Behavioral/Psych: negative Endocrine: negative Allergic/Immunologic: negative    Physical exam BP 135/83   Pulse 98   Temp 98.5 F (36.9 C) (Oral)   Ht 5' 1"  (1.549 m)   Wt 133 lb (60.3 kg)   BMI 25.13 kg/m  General appearance: alert, cooperative, appears stated age and no distress Head: Normocephalic, without obvious abnormality,  atraumatic Eyes: negative findings: lids and lashes normal, conjunctivae and sclerae normal and corneas clear Ears: normal TM's and external ear canals both ears Nose: Nares normal. Septum midline. Mucosa normal. No drainage or sinus tenderness. Throat: lips, mucosa, and tongue normal; teeth and gums normal Neck: no adenopathy, no carotid bruit, no JVD, supple, symmetrical, trachea midline and thyroid not enlarged, symmetric, no tenderness/mass/nodules Back: Decreased active range of motion of C-spine, particularly in extension. Lungs: clear to auscultation bilaterally Heart: regular rate and rhythm, S1, S2 normal, no murmur, click, rub or gallop Abdomen: soft, non-tender; bowel sounds normal; no masses,  no organomegaly Pelvic: Sclerotic changes particularly the base of the vagina/introitus noted.  She has hypopigmentation of the skin in this area which also has a shiny appearance.  Perineum with similar appearance.  There is no appreciable vaginal discharge.  No bleeding.  No other vaginal lesions noted. external vagina atrophic Extremities: extremities normal, atraumatic, no cyanosis or edema Pulses: 2+ and symmetric Skin: Skin color, texture, turgor normal. No rashes or lesions or except for as above Lymph nodes: Cervical, supraclavicular, and axillary nodes normal. Neurologic: Alert and oriented X 3, normal strength and tone. Normal symmetric reflexes. Normal coordination and gait Psych: Mood stable, speech normal, affect appropriate, pleasant and interactive Depression screen [facility] 2/9 07/20/2018 09/22/2017 07/01/2017  Decreased Interest 0 0 0  Down, Depressed, Hopeless 0 0 0  PHQ - 2 Score 0 0 0  Altered sleeping 0 - -  Tired, decreased energy 0 - -  Change in appetite 0 - -  Feeling bad or failure about yourself  0 - -  Trouble concentrating 0 - -  Moving slowly or fidgety/restless 0 - -  Suicidal thoughts 0 - -  PHQ-9 Score 0 - -    Assessment/ Plan: Taylor Craig here for  annual physical exam.   1. Annual physical exam Discussed colonoscopy.  2. Pure hypertriglyceridemia Patient is not fasting - Lipid Panel - CMP14+EGFR - TSH  3. Elevated liver enzymes Check TSH and CMP. - CMP14+EGFR - TSH  4. Screening, anemia, deficiency, iron - CBC  5. Screening for HIV without presence of risk factors - HIV antibody (with reflex)  6. Lichen sclerosus of female genitalia We discussed using this nightly.  If needed okay to use twice daily.  She will use for 7 to 10 days and if symptoms resolve okay to discontinue use.  We discussed that if symptoms worsen she is to follow-up in office at which point we may consider referral to GYN for biopsy of the affected area. - clobetasol cream (TEMOVATE) 0.05 %; Apply to the affected areas on vagina twice daily for 2 weeks.  Then use up to 3 times weekly if needed.  Dispense: 45 g; Refill: 2  7. Hyperlipidemia, unspecified hyperlipidemia type - atorvastatin (LIPITOR) 80 MG tablet; TAKE 1 TABLET BY MOUTH EVERY DAY IN THE EVENING  Dispense:  90 tablet; Refill: 3  8. Restless leg We will check iron level.  If labs are normal plan to start low-dose Requip and have her follow-up in about 6 to 8 weeks for medication titration. - Ferritin   Counseled on healthy lifestyle choices, including diet (rich in fruits, vegetables and lean meats and low in salt and simple carbohydrates) and exercise (at least 30 minutes of moderate physical activity daily).  Patient to follow up in 1 year for annual exam or sooner if needed.  Shishir Krantz M. Lajuana Ripple, DO

## 2018-07-21 ENCOUNTER — Other Ambulatory Visit: Payer: Self-pay | Admitting: Family Medicine

## 2018-07-21 DIAGNOSIS — G2581 Restless legs syndrome: Secondary | ICD-10-CM

## 2018-07-21 LAB — CBC
Hematocrit: 42.1 % (ref 34.0–46.6)
Hemoglobin: 14.3 g/dL (ref 11.1–15.9)
MCH: 32.7 pg (ref 26.6–33.0)
MCHC: 34 g/dL (ref 31.5–35.7)
MCV: 96 fL (ref 79–97)
Platelets: 289 10*3/uL (ref 150–450)
RBC: 4.37 x10E6/uL (ref 3.77–5.28)
RDW: 11.7 % (ref 11.7–15.4)
WBC: 7.7 10*3/uL (ref 3.4–10.8)

## 2018-07-21 LAB — CMP14+EGFR
ALT: 42 IU/L — ABNORMAL HIGH (ref 0–32)
AST: 40 IU/L (ref 0–40)
Albumin/Globulin Ratio: 2.2 (ref 1.2–2.2)
Albumin: 5.1 g/dL — ABNORMAL HIGH (ref 3.8–4.8)
Alkaline Phosphatase: 70 IU/L (ref 39–117)
BUN/Creatinine Ratio: 13 (ref 12–28)
BUN: 11 mg/dL (ref 8–27)
Bilirubin Total: 0.3 mg/dL (ref 0.0–1.2)
CO2: 22 mmol/L (ref 20–29)
Calcium: 9.5 mg/dL (ref 8.7–10.3)
Chloride: 102 mmol/L (ref 96–106)
Creatinine, Ser: 0.82 mg/dL (ref 0.57–1.00)
GFR calc Af Amer: 87 mL/min/{1.73_m2} (ref >59)
GFR calc non Af Amer: 76 mL/min/{1.73_m2} (ref >59)
Globulin, Total: 2.3 g/dL (ref 1.5–4.5)
Glucose: 100 mg/dL — ABNORMAL HIGH (ref 65–99)
Potassium: 4.3 mmol/L (ref 3.5–5.2)
Sodium: 141 mmol/L (ref 134–144)
Total Protein: 7.4 g/dL (ref 6.0–8.5)

## 2018-07-21 LAB — LIPID PANEL
Chol/HDL Ratio: 2.5 ratio (ref 0.0–4.4)
Cholesterol, Total: 178 mg/dL (ref 100–199)
HDL: 71 mg/dL (ref >39)
LDL Calculated: 85 mg/dL (ref 0–99)
Triglycerides: 112 mg/dL (ref 0–149)
VLDL Cholesterol Cal: 22 mg/dL (ref 5–40)

## 2018-07-21 LAB — TSH: TSH: 2.09 u[IU]/mL (ref 0.450–4.500)

## 2018-07-21 LAB — HIV ANTIBODY (ROUTINE TESTING W REFLEX): HIV Screen 4th Generation wRfx: NONREACTIVE

## 2018-07-21 LAB — FERRITIN: Ferritin: 193 ng/mL — ABNORMAL HIGH (ref 15–150)

## 2018-07-21 MED ORDER — ROPINIROLE HCL 0.25 MG PO TABS: ORAL_TABLET | ORAL | 0 refills | Status: DC

## 2018-08-03 ENCOUNTER — Telehealth: Payer: Self-pay | Admitting: Physical Medicine and Rehabilitation

## 2018-08-03 NOTE — Telephone Encounter (Signed)
Needs auth for [id number]. Scheduled for 7/27.

## 2018-08-03 NOTE — Telephone Encounter (Signed)
ok 

## 2018-08-03 NOTE — Telephone Encounter (Signed)
Submitted Fax to [employer] [place], case is pending.

## 2018-08-09 ENCOUNTER — Other Ambulatory Visit: Payer: Self-pay

## 2018-08-09 ENCOUNTER — Ambulatory Visit
Admission: RE | Admit: 2018-08-09 | Discharge: 2018-08-09 | Disposition: A | Discharge status: Home / Self Care | Payer: PRIVATE HEALTH INSURANCE | Source: Ambulatory Visit | Attending: Family Medicine | Admitting: Family Medicine

## 2018-08-09 ENCOUNTER — Telehealth: Payer: Self-pay | Admitting: *Deleted

## 2018-08-09 DIAGNOSIS — Z1231 Encounter for screening mammogram for malignant neoplasm of breast: Secondary | ICD-10-CM

## 2018-08-09 NOTE — Telephone Encounter (Signed)
Refaxed Clinical Notes to [employer] to [phone]

## 2018-08-12 LAB — COLOGUARD: Cologuard: NEGATIVE

## 2018-08-22 ENCOUNTER — Encounter: Payer: Self-pay | Admitting: Physical Medicine and Rehabilitation

## 2018-08-22 ENCOUNTER — Ambulatory Visit (INDEPENDENT_AMBULATORY_CARE_PROVIDER_SITE_OTHER): Payer: PRIVATE HEALTH INSURANCE | Admitting: Physical Medicine and Rehabilitation

## 2018-08-22 ENCOUNTER — Ambulatory Visit: Payer: Self-pay

## 2018-08-22 ENCOUNTER — Other Ambulatory Visit: Payer: Self-pay

## 2018-08-22 VITALS — BP 128/67 | HR 81 | Temp 98.0°F

## 2018-08-22 DIAGNOSIS — M5412 Radiculopathy, cervical region: Secondary | ICD-10-CM | POA: Diagnosis not present

## 2018-08-22 MED ORDER — METHYLPREDNISOLONE ACETATE 80 MG/ML IJ SUSP
80.0000 mg | Freq: Once | INTRAMUSCULAR | Status: AC
  Administered 2018-08-22: 80 mg

## 2018-08-22 NOTE — Progress Notes (Signed)
.  Numeric Pain Rating Scale and Functional Assessment Average Pain 6   In the last MONTH (on 0-10 scale) has pain interfered with the following?  1. General activity like being  able to carry out your everyday physical activities such as walking, climbing stairs, carrying groceries, or moving a chair?  Rating(5)   +Driver, -BT, -Dye Allergies.  

## 2018-08-23 ENCOUNTER — Other Ambulatory Visit: Payer: Self-pay | Admitting: Family Medicine

## 2018-08-23 DIAGNOSIS — G2581 Restless legs syndrome: Secondary | ICD-10-CM

## 2018-08-23 NOTE — Progress Notes (Signed)
Taylor Craig - 63 y.o. female MRN [id number]  Date of birth: Aug 30, 1955  Office Visit Note: Visit Date: 08/22/2018 PCP: Janora Norlander, DO Referred by: Janora Norlander, DO  Subjective: Chief Complaint  Patient presents with  . Spine - Pain   HPI:  Taylor Craig is a 63 y.o. female who comes in today For planned repeat right C7-T1 interlaminar epidural steroid injection.  Patient has pretty significant multilevel spondylosis and degenerative change without high-grade central stenosis.  She continues to have spasming type pain that refers from the shoulder blade area up through the head more of a myofascial pain pattern but clearly spasming.  She also gets a lot of neck pain and shoulder pain.  Last injection actually gave her a lot of relief of the spasming and she is only had it on 2 occasions since we saw her which is much less than before.  I think repeating the injection is worthwhile since she did get good relief with that.  We also may have to look at focal trigger point injection at some point even though she has had dry needling in the past.  She continues to use over-the-counter Tylenol she is tried muscle relaxers without relief.  We discussed during the day relaxation techniques and mechanical techniques to get some muscle relaxation so maybe she would not have as many spasms we also talked about strengthening with isometric neck exercises.  ROS Otherwise per HPI.  Assessment & Plan: Visit Diagnoses:  1. Cervical radiculopathy     Plan: No additional findings.   Meds & Orders:  Meds ordered this encounter  Medications  . methylPREDNISolone acetate (DEPO-MEDROL) injection 80 mg    Orders Placed This Encounter  Procedures  . XR C-ARM NO REPORT  . Epidural Steroid injection    Follow-up: Return if symptoms worsen or fail to improve.   Procedures: No procedures performed  Cervical Epidural Steroid Injection - Interlaminar Approach with Fluoroscopic Guidance   Patient: Taylor Craig      Date of Birth: 05-11-1955 MRN: [id number] PCP: Janora Norlander, DO      Visit Date: 08/22/2018   Universal Protocol:    Date/Time: 07/28/205:48 AM  Consent Given By: the patient  Position: PRONE  Additional Comments: Vital signs were monitored before and after the procedure. Patient was prepped and draped in the usual sterile fashion. The correct patient, procedure, and site was verified.   Injection Procedure Details:  Procedure Site One Meds Administered:  Meds ordered this encounter  Medications  . methylPREDNISolone acetate (DEPO-MEDROL) injection 80 mg     Laterality: Right  Location/Site: C7-T1  Needle size: 20 G  Needle type: Touhy  Needle Placement: Paramedian epidural space  Findings:  -Comments: Excellent flow of contrast into the epidural space.  Procedure Details: Using a paramedian approach from the side mentioned above, the region overlying the inferior lamina was localized under fluoroscopic visualization and the soft tissues overlying this structure were infiltrated with 4 ml. of 1% Lidocaine without Epinephrine. A # 20 gauge, Tuohy needle was inserted into the epidural space using a paramedian approach.  The epidural space was localized using loss of resistance along with lateral and contralateral oblique bi-planar fluoroscopic views.  After negative aspirate for air, blood, and CSF, a 2 ml. volume of Isovue-250 was injected into the epidural space and the flow of contrast was observed. Radiographs were obtained for documentation purposes.   The injectate was administered into the level noted above.  Additional Comments:  The patient tolerated the procedure well Dressing: 2 x 2 sterile gauze and Band-Aid    Post-procedure details: Patient was observed during the procedure. Post-procedure instructions were reviewed.  Patient left the clinic in stable condition.    Clinical History: MRI CERVICAL SPINE  WITHOUT CONTRAST  TECHNIQUE: Multiplanar, multisequence MR imaging of the cervical spine was performed. No intravenous contrast was administered.  COMPARISON:  07/16/2014  FINDINGS: The craniocervical junction appears unremarkable. There is 1.5 mm degenerative anterolisthesis at C2-3. Type 2 degenerative endplate findings at A0-6.  Disc desiccation noted throughout cervical spine with loss of disc height especially at C4-5, C5-6, and C6-7.  No significant abnormal spinal cord signal is observed. No significant vertebral marrow edema is identified. Additional findings at individual levels are as follows:  C2-3:  Unremarkable.  C3-4: Mild right foraminal stenosis and borderline central narrowing of the thecal sac due to uncinate spurring and disc bulge.  C4-5: Mild bilateral foraminal stenosis and borderline central narrowing of the thecal sac due to uncinate spurring and disc bulge.  C5-6: Moderate bilateral foraminal stenosis and mild right eccentric central narrowing of the thecal sac due to intervertebral spurring and disc bulge.  C6-7: Mild bilateral foraminal stenosis and borderline central narrowing of the thecal sac due to disc bulge.  C7-T1:  Unremarkable.  IMPRESSION: 1. Cervical spondylosis and degenerative disc disease, causing moderate impingement at C5-6 and mild impingement at C3-4, C4-5, and C6-7, as detailed above.   Electronically Signed   By: Van Clines M.D.   On: 07/16/2014 14:35     Objective:  VS:  HT:    WT:   BMI:     BP:128/67  HR:81bpm  TEMP:98 F (36.7 C)(Tympanic)  RESP:  Physical Exam  Ortho Exam Imaging: Xr C-arm No Report  Result Date: 08/22/2018 Please see Notes tab for imaging impression.

## 2018-08-23 NOTE — Procedures (Signed)
Cervical Epidural Steroid Injection - Interlaminar Approach with Fluoroscopic Guidance  Patient: Taylor Craig      Date of Birth: [birth year] MRN: [id number] PCP: Janora Norlander, DO      Visit Date: 08/22/2018   Universal Protocol:    Date/Time: 07/28/205:48 AM  Consent Given By: the patient  Position: PRONE  Additional Comments: Vital signs were monitored before and after the procedure. Patient was prepped and draped in the usual sterile fashion. The correct patient, procedure, and site was verified.   Injection Procedure Details:  Procedure Site One Meds Administered:  Meds ordered this encounter  Medications  . methylPREDNISolone acetate (DEPO-MEDROL) injection 80 mg     Laterality: Right  Location/Site: C7-T1  Needle size: 20 G  Needle type: Touhy  Needle Placement: Paramedian epidural space  Findings:  -Comments: Excellent flow of contrast into the epidural space.  Procedure Details: Using a paramedian approach from the side mentioned above, the region overlying the inferior lamina was localized under fluoroscopic visualization and the soft tissues overlying this structure were infiltrated with 4 ml. of 1% Lidocaine without Epinephrine. A # 20 gauge, Tuohy needle was inserted into the epidural space using a paramedian approach.  The epidural space was localized using loss of resistance along with lateral and contralateral oblique bi-planar fluoroscopic views.  After negative aspirate for air, blood, and CSF, a 2 ml. volume of Isovue-250 was injected into the epidural space and the flow of contrast was observed. Radiographs were obtained for documentation purposes.   The injectate was administered into the level noted above.  Additional Comments:  The patient tolerated the procedure well Dressing: 2 x 2 sterile gauze and Band-Aid    Post-procedure details: Patient was observed during the procedure. Post-procedure instructions were reviewed.  Patient  left the clinic in stable condition.

## 2018-08-31 ENCOUNTER — Other Ambulatory Visit: Payer: Self-pay

## 2018-08-31 ENCOUNTER — Ambulatory Visit (INDEPENDENT_AMBULATORY_CARE_PROVIDER_SITE_OTHER): Payer: PRIVATE HEALTH INSURANCE | Admitting: Family Medicine

## 2018-08-31 ENCOUNTER — Encounter: Payer: Self-pay | Admitting: Family Medicine

## 2018-08-31 DIAGNOSIS — G2581 Restless legs syndrome: Secondary | ICD-10-CM

## 2018-08-31 DIAGNOSIS — N904 Leukoplakia of vulva: Secondary | ICD-10-CM

## 2018-08-31 MED ORDER — ROPINIROLE HCL 0.25 MG PO TABS: 0.2500 mg | ORAL_TABLET | Freq: Every day | ORAL | 2 refills | Status: DC

## 2018-08-31 NOTE — Progress Notes (Signed)
Telephone visit  Subjective: CC: Restless Legs PCP: Janora Norlander, DO [id number] Taylor Craig is a 63 y.o. female calls for telephone consult today. Patient provides verbal consent for consult held via phone.  Location of patient: home Location of provider: Working remotely from home Others present for call: none  1.  Restless leg syndrome Patient notes that she has had an excellent response to the Requip.  She notes from day 1 she has been having much improvement in the restless leg symptoms and has been sleeping through the night.  She did advance to the 0.5 mg but wants to know she can go back to 0.25 mg as this did seem to help.  Denies any excessive sedation during the daytime or any other complications from medication.  2.  Lichen sclerosis Patient reports she had an excellent response to the topical clobetasol.  She was able to discontinue the cream after a few days as symptoms had totally resolved.  She has no concerns with regards to genital irritation.   ROS: Per HPI  No Known Allergies Past Medical History:  Diagnosis Date  . Cancer ([place]) 2004   melanoma   . Hyperlipemia     Current Outpatient Medications:  .  atorvastatin (LIPITOR) 80 MG tablet, TAKE 1 TABLET BY MOUTH EVERY DAY IN THE EVENING, Disp: 90 tablet, Rfl: 3 .  clobetasol cream (TEMOVATE) 0.05 %, Apply to the affected areas on vagina twice daily for 2 weeks.  Then use up to 3 times weekly if needed., Disp: 45 g, Rfl: 2 .  Multiple Vitamin (MULTIVITAMIN WITH MINERALS) TABS tablet, Take 1 tablet by mouth daily., Disp: , Rfl:  .  rOPINIRole (REQUIP) 0.25 MG tablet, TAKE 1 TABLET BY MOUTH AT BEDTIME FOR 7 DAYS, THEN 2 TABLETS AT BEDTIME FOR 21 DAYS., Disp: 60 tablet, Rfl: 0  Assessment/ Plan: 63 y.o. female   1. Restless legs syndrome (RLS) Very pleased with her response to the Requip 0.25 mg tablets.  I have encouraged her to continue this current days and if we need to increase to 0.5 mg in the future we  can certainly can.  I have sent in a renewal of this medication.  She will follow-up with me on a as needed basis or in 6 months.  Encouraged her to come in for influenza shot starting in October.  She will schedule this - rOPINIRole (REQUIP) 0.25 MG tablet; Take 1 tablet (0.25 mg total) by mouth at bedtime.  Dispense: 90 tablet; Refill: 2  2. Lichen sclerosus of female genitalia She had an excellent response to the clobetasol.  Symptoms have resolved.  She will follow-up PRN on this issue    Start time: 12:59pm End time: 1:04pm  Total time spent on patient care (including telephone call/ virtual visit): 10 minutes  [place], [place] [phone]

## 2018-09-12 ENCOUNTER — Telehealth: Payer: Self-pay | Admitting: Physical Medicine and Rehabilitation

## 2018-09-12 NOTE — Telephone Encounter (Signed)
If 50% relief then rpeat, if not then either me for OV/tigger point or PT dry needling

## 2018-09-12 NOTE — Telephone Encounter (Signed)
Submitted/Faxed PA to [phone], case is pending.

## 2018-09-12 NOTE — Telephone Encounter (Signed)
Auth needed for [id number]. Scheduled for 8/31 with driver and no blood thinners.

## 2018-09-13 ENCOUNTER — Ambulatory Visit (INDEPENDENT_AMBULATORY_CARE_PROVIDER_SITE_OTHER): Payer: PRIVATE HEALTH INSURANCE | Admitting: Nurse Practitioner

## 2018-09-13 ENCOUNTER — Encounter: Payer: Self-pay | Admitting: Nurse Practitioner

## 2018-09-13 ENCOUNTER — Other Ambulatory Visit: Payer: Self-pay

## 2018-09-13 VITALS — BP 142/74 | HR 80 | Temp 98.0°F | Ht 61.0 in | Wt 138.0 lb

## 2018-09-13 DIAGNOSIS — S80861A Insect bite (nonvenomous), right lower leg, initial encounter: Secondary | ICD-10-CM | POA: Diagnosis not present

## 2018-09-13 DIAGNOSIS — W57XXXA Bitten or stung by nonvenomous insect and other nonvenomous arthropods, initial encounter: Secondary | ICD-10-CM

## 2018-09-13 MED ORDER — DOXYCYCLINE HYCLATE 100 MG PO TABS: 100.0000 mg | ORAL_TABLET | Freq: Two times a day (BID) | ORAL | 0 refills | Status: DC

## 2018-09-13 NOTE — Patient Instructions (Signed)

## 2018-09-13 NOTE — Progress Notes (Signed)
   Subjective:    Patient ID: Taylor Craig, female    DOB: Apr 17, 1955, 63 y.o.   MRN: [id number]   Chief Complaint: ? bite to lower leg   HPI Patient comes in c/o a bite right lower leg. She is not sure what bit her but started itching on Saturday. It is now red and swollen and sor eto touch.   Review of Systems  Constitutional: Negative for activity change and appetite change.  HENT: Negative.   Eyes: Negative for pain.  Respiratory: Negative for shortness of breath.   Cardiovascular: Negative for chest pain, palpitations and leg swelling.  Gastrointestinal: Negative for abdominal pain.  Endocrine: Negative for polydipsia.  Genitourinary: Negative.   Skin: Negative for rash.  Neurological: Negative for dizziness, weakness and headaches.  Hematological: Does not bruise/bleed easily.  Psychiatric/Behavioral: Negative.   All other systems reviewed and are negative.      Objective:   Physical Exam Vitals signs and nursing note reviewed.  Constitutional:      Appearance: Normal appearance.  Cardiovascular:     Rate and Rhythm: Normal rate and regular rhythm.     Heart sounds: Normal heart sounds.  Pulmonary:     Effort: Pulmonary effort is normal.     Breath sounds: Normal breath sounds.  Skin:    General: Skin is warm.  Neurological:     General: No focal deficit present.     Mental Status: She is alert and oriented to person, place, and time.  Psychiatric:        Mood and Affect: Mood normal.        Behavior: Behavior normal.    BP (!) 142/74   Pulse 80   Temp 98 F (36.7 C) (Oral)   Ht 5\' 1"  (1.549 m)   Wt 138 lb (62.6 kg)   BMI 26.07 kg/m           Assessment & Plan:  [name] in today with chief complaint of ? bite to lower leg   1. Insect bite of right lower extremity, initial encounter Meds ordered this encounter  Medications  . doxycycline (VIBRA-TABS) 100 MG tablet    Sig: Take 1 tablet (100 mg total) by mouth 2 (two) times  daily. 1 po bid    Dispense:  20 tablet    Refill:  0    Order Specific Question:   Supervising Provider    Answer:   Caryl Pina A [[id number]]   Avoid scratching or picking Cool compresses as needed Clean with antbacterial sopa BID RTO prn  Mary-Giara Hassell Done, FNP

## 2018-09-15 ENCOUNTER — Other Ambulatory Visit: Payer: Self-pay | Admitting: Family Medicine

## 2018-09-15 DIAGNOSIS — G2581 Restless legs syndrome: Secondary | ICD-10-CM

## 2018-09-15 NOTE — Telephone Encounter (Signed)
Pt states that last injection helped and gave her a 60% of relief.

## 2018-09-15 NOTE — Telephone Encounter (Signed)
This was sent 08/31/2018 #90 w 2 refills.  Can you have her check with her pharmacy?

## 2018-09-21 ENCOUNTER — Other Ambulatory Visit: Payer: Self-pay | Admitting: *Deleted

## 2018-09-23 ENCOUNTER — Telehealth: Payer: Self-pay | Admitting: *Deleted

## 2018-09-23 NOTE — Telephone Encounter (Signed)
Spoke with Tresa Garter and auth was approved Auth# [id number]  Effective 09/12/2018-10/13/2018

## 2018-09-26 ENCOUNTER — Encounter: Payer: Self-pay | Admitting: Physical Medicine and Rehabilitation

## 2018-09-26 ENCOUNTER — Ambulatory Visit: Payer: Self-pay

## 2018-09-26 ENCOUNTER — Ambulatory Visit (INDEPENDENT_AMBULATORY_CARE_PROVIDER_SITE_OTHER): Payer: Medicare Other | Admitting: Physical Medicine and Rehabilitation

## 2018-09-26 VITALS — BP 132/70 | HR 66

## 2018-09-26 DIAGNOSIS — M5412 Radiculopathy, cervical region: Secondary | ICD-10-CM

## 2018-09-26 MED ORDER — METHYLPREDNISOLONE ACETATE 80 MG/ML IJ SUSP
80.0000 mg | Freq: Once | INTRAMUSCULAR | Status: AC
  Administered 2018-09-26: 16:00:00 80 mg

## 2018-09-26 NOTE — Progress Notes (Signed)
 .  Numeric Pain Rating Scale and Functional Assessment Average Pain 6   In the last MONTH (on 0-10 scale) has pain interfered with the following?  1. General activity like being  able to carry out your everyday physical activities such as walking, climbing stairs, carrying groceries, or moving a chair?  Rating(7)   +Driver, -BT, -Dye Allergies.  

## 2018-09-27 NOTE — Procedures (Signed)
Cervical Epidural Steroid Injection - Interlaminar Approach with Fluoroscopic Guidance  Patient: Taylor Craig      Date of Birth: [birth year] MRN: [id number] PCP: Janora Norlander, DO      Visit Date: 09/26/2018   Universal Protocol:    Date/Time: 09/01/205:56 AM  Consent Given By: the patient  Position: PRONE  Additional Comments: Vital signs were monitored before and after the procedure. Patient was prepped and draped in the usual sterile fashion. The correct patient, procedure, and site was verified.   Injection Procedure Details:  Procedure Site One Meds Administered:  Meds ordered this encounter  Medications  . methylPREDNISolone acetate (DEPO-MEDROL) injection 80 mg     Laterality: Right  Location/Site: C7-T1  Needle size: 20 G  Needle type: Touhy  Needle Placement: Paramedian epidural space  Findings:  -Comments: Excellent flow of contrast into the epidural space.  Procedure Details: Using a paramedian approach from the side mentioned above, the region overlying the inferior lamina was localized under fluoroscopic visualization and the soft tissues overlying this structure were infiltrated with 4 ml. of 1% Lidocaine without Epinephrine. A # 20 gauge, Tuohy needle was inserted into the epidural space using a paramedian approach.  The epidural space was localized using loss of resistance along with lateral and contralateral oblique bi-planar fluoroscopic views.  After negative aspirate for air, blood, and CSF, a 2 ml. volume of Isovue-250 was injected into the epidural space and the flow of contrast was observed. Radiographs were obtained for documentation purposes.   The injectate was administered into the level noted above.  Additional Comments:  The patient tolerated the procedure well Dressing: 2 x 2 sterile gauze and Band-Aid    Post-procedure details: Patient was observed during the procedure. Post-procedure instructions were reviewed.  Patient  left the clinic in stable condition.

## 2018-09-27 NOTE — Progress Notes (Signed)
Taylor Craig - 63 y.o. female MRN [id number]  Date of birth: 02/03/1955  Office Visit Note: Visit Date: 09/26/2018 PCP: Janora Norlander, DO Referred by: Janora Norlander, DO  Subjective: Chief Complaint  Patient presents with  . Neck - Pain  . Right Shoulder - Pain  . Left Shoulder - Pain   HPI: Taylor Craig is a 63 y.o. female who comes in today For planned repeat right C7-T1 interlaminar epidural steroid injection.  Interestingly the patient reports that she had several weeks of relief from the prior injection but also reports that she has had increasing pain or return of pain for several weeks.  In point of fact we only completed the injection about a month ago or 4 weeks ago.  Spoke with her at length about this and she does report excellent relief from the injection.  She has had prior injections as noted in the chart with really good relief overall.  She has multilevel spondylosis particular at C5-6 with mild stenosis and foraminal narrowing.  She also has some level of myofascial pain and a lot of pain in the upper cervical region referral into both shoulders.  At this point I think the best approach is to go ahead and repeat the injection she did get excellent relief for probably about 2 to 3 weeks.  Obviously we would like to see that last longer in the past it has.  If it does not seem to last very long this time we would look at a combination of repeating MRI which was last done in 2015 and also regrouping with physical therapy for dry needling and manual treatment.  ROS Otherwise per HPI.  Assessment & Plan: Visit Diagnoses:  1. Cervical radiculopathy     Plan: No additional findings.   Meds & Orders:  Meds ordered this encounter  Medications  . methylPREDNISolone acetate (DEPO-MEDROL) injection 80 mg    Orders Placed This Encounter  Procedures  . XR C-ARM NO REPORT  . Epidural Steroid injection    Follow-up: Return if symptoms worsen or fail to improve.    Procedures: No procedures performed  Cervical Epidural Steroid Injection - Interlaminar Approach with Fluoroscopic Guidance  Patient: Taylor Craig      Date of Birth: 11/10/1955 MRN: [id number] PCP: Janora Norlander, DO      Visit Date: 09/26/2018   Universal Protocol:    Date/Time: 09/01/205:56 AM  Consent Given By: the patient  Position: PRONE  Additional Comments: Vital signs were monitored before and after the procedure. Patient was prepped and draped in the usual sterile fashion. The correct patient, procedure, and site was verified.   Injection Procedure Details:  Procedure Site One Meds Administered:  Meds ordered this encounter  Medications  . methylPREDNISolone acetate (DEPO-MEDROL) injection 80 mg     Laterality: Right  Location/Site: C7-T1  Needle size: 20 G  Needle type: Touhy  Needle Placement: Paramedian epidural space  Findings:  -Comments: Excellent flow of contrast into the epidural space.  Procedure Details: Using a paramedian approach from the side mentioned above, the region overlying the inferior lamina was localized under fluoroscopic visualization and the soft tissues overlying this structure were infiltrated with 4 ml. of 1% Lidocaine without Epinephrine. A # 20 gauge, Tuohy needle was inserted into the epidural space using a paramedian approach.  The epidural space was localized using loss of resistance along with lateral and contralateral oblique bi-planar fluoroscopic views.  After negative aspirate for air, blood,  and CSF, a 2 ml. volume of Isovue-250 was injected into the epidural space and the flow of contrast was observed. Radiographs were obtained for documentation purposes.   The injectate was administered into the level noted above.  Additional Comments:  The patient tolerated the procedure well Dressing: 2 x 2 sterile gauze and Band-Aid    Post-procedure details: Patient was observed during the procedure.  Post-procedure instructions were reviewed.  Patient left the clinic in stable condition.    Clinical History: MRI CERVICAL SPINE WITHOUT CONTRAST  TECHNIQUE: Multiplanar, multisequence MR imaging of the cervical spine was performed. No intravenous contrast was administered.  COMPARISON:  07/16/2014  FINDINGS: The craniocervical junction appears unremarkable. There is 1.5 mm degenerative anterolisthesis at C2-3. Type 2 degenerative endplate findings at 075-GRM.  Disc desiccation noted throughout cervical spine with loss of disc height especially at C4-5, C5-6, and C6-7.  No significant abnormal spinal cord signal is observed. No significant vertebral marrow edema is identified. Additional findings at individual levels are as follows:  C2-3:  Unremarkable.  C3-4: Mild right foraminal stenosis and borderline central narrowing of the thecal sac due to uncinate spurring and disc bulge.  C4-5: Mild bilateral foraminal stenosis and borderline central narrowing of the thecal sac due to uncinate spurring and disc bulge.  C5-6: Moderate bilateral foraminal stenosis and mild right eccentric central narrowing of the thecal sac due to intervertebral spurring and disc bulge.  C6-7: Mild bilateral foraminal stenosis and borderline central narrowing of the thecal sac due to disc bulge.  C7-T1:  Unremarkable.  IMPRESSION: 1. Cervical spondylosis and degenerative disc disease, causing moderate impingement at C5-6 and mild impingement at C3-4, C4-5, and C6-7, as detailed above.   Electronically Signed   By: Van Clines M.D.   On: 07/16/2014 14:35   She reports that she has never smoked. She has never used smokeless tobacco. No results for input(s): HGBA1C, LABURIC in the last 8760 hours.  Objective:  VS:  HT:    WT:   BMI:     BP:132/70  HR:66bpm  TEMP: ( )  RESP:  Physical Exam  Ortho Exam Imaging: Xr C-arm No Report  Result Date: 09/26/2018 Please  see Notes tab for imaging impression.   Past Medical/Family/Surgical/Social History: Medications & Allergies reviewed per EMR, new medications updated. Patient Active Problem List   Diagnosis Date Noted  . H/O malignant melanoma of skin 07/27/2016  . Vertigo 04/01/2015  . Tongue fasciculation 03/14/2015  . Episodic lightheadedness 03/14/2015  . Cephalalgia 03/14/2015  . White coat syndrome with hypertension 03/14/2015  . Osteoarthritis of neck 09/25/2014  . BMI 30.0-30.9,adult 09/25/2014  . Hyperlipidemia 09/25/2014  . Elevated liver enzymes 07/20/2013   Past Medical History:  Diagnosis Date  . Cancer ([place]) 2004   melanoma   . Hyperlipemia    History reviewed. No pertinent family history. Past Surgical History:  Procedure Laterality Date  . ABDOMINAL HYSTERECTOMY    . BREAST EXCISIONAL BIOPSY Left   . SKIN CANCER EXCISION     Social History   Occupational History  . Not on file  Tobacco Use  . Smoking status: Never Smoker  . Smokeless tobacco: Never Used  Substance and Sexual Activity  . Alcohol use: No  . Drug use: No  . Sexual activity: Yes

## 2018-09-29 ENCOUNTER — Other Ambulatory Visit: Payer: Self-pay

## 2018-09-30 ENCOUNTER — Encounter: Payer: Self-pay | Admitting: Family Medicine

## 2018-09-30 ENCOUNTER — Ambulatory Visit (INDEPENDENT_AMBULATORY_CARE_PROVIDER_SITE_OTHER): Payer: Medicare Other | Admitting: Family Medicine

## 2018-09-30 VITALS — BP 137/78 | HR 86 | Temp 99.1°F | Ht 61.0 in | Wt 138.0 lb

## 2018-09-30 DIAGNOSIS — Z23 Encounter for immunization: Secondary | ICD-10-CM | POA: Diagnosis not present

## 2018-09-30 DIAGNOSIS — Z85820 Personal history of malignant melanoma of skin: Secondary | ICD-10-CM | POA: Diagnosis not present

## 2018-09-30 DIAGNOSIS — L918 Other hypertrophic disorders of the skin: Secondary | ICD-10-CM | POA: Diagnosis not present

## 2018-09-30 DIAGNOSIS — L821 Other seborrheic keratosis: Secondary | ICD-10-CM | POA: Diagnosis not present

## 2018-09-30 NOTE — Patient Instructions (Addendum)
Get a full body exam scheduled with Dr Nevada Crane. You had an inflamed skin tag removed today.  If you have increased redness/ pain/ bleeding/ heat or pus from lesion, contact me immediately!  Seborrheic Keratosis A seborrheic keratosis is a common, noncancerous (benign) skin growth. These growths are velvety, waxy, rough, tan, brown, or black spots that appear on the skin. These skin growths can be flat or raised, and scaly. What are the causes? The cause of this condition is not known. What increases the risk? You are more likely to develop this condition if you:  Have a family history of seborrheic keratosis.  Are 50 or older.  Are pregnant.  Have had estrogen replacement therapy. What are the signs or symptoms? Symptoms of this condition include growths on the face, chest, shoulders, back, or other areas. These growths:  Are usually painless, but may become irritated and itchy.  Can be yellow, brown, black, or other colors.  Are slightly raised or have a flat surface.  Are sometimes rough or wart-like in texture.  Are often velvety or waxy on the surface.  Are round or oval-shaped.  Often occur in groups, but may occur as a single growth. How is this diagnosed? This condition is diagnosed with a medical history and physical exam.  A sample of the growth may be tested (skin biopsy).  You may need to see a skin specialist (dermatologist). How is this treated? Treatment is not usually needed for this condition, unless the growths are irritated or bleed often.  You may also choose to have the growths removed if you do not like their appearance. ? Most commonly, these growths are treated with a procedure in which liquid nitrogen is applied to "freeze" off the growth (cryosurgery). ? They may also be burned off with electricity (electrocautery) or removed by scraping (curettage). Follow these instructions at home:  Watch your growth for any changes.  Keep all follow-up visits  as told by your health care provider. This is important.  Do not scratch or pick at the growth or growths. This can cause them to become irritated or infected. Contact a health care provider if:  You suddenly have many new growths.  Your growth bleeds, itches, or hurts.  Your growth suddenly becomes larger or changes color. Summary  A seborrheic keratosis is a common, noncancerous (benign) skin growth.  Treatment is not usually needed for this condition, unless the growths are irritated or bleed often.  Watch your growth for any changes.  Contact a health care provider if you suddenly have many new growths or your growth suddenly becomes larger or changes color.  Keep all follow-up visits as told by your health care provider. This is important. This information is not intended to replace advice given to you by your health care provider. Make sure you discuss any questions you have with your health care provider. Document Released: 02/14/2010 Document Revised: 05/27/2017 Document Reviewed: 05/27/2017 Elsevier Patient Education  2020 [employer].

## 2018-09-30 NOTE — Progress Notes (Signed)
Subjective: CC: skin lesions PCP: Janora Norlander, DO [id number] Taylor Craig is a 63 y.o. female presenting to clinic today for:  1.  Skin lesions Patient reports a lesion on the right posterior back that she noticed a couple of months ago.  It got caught on her bra and was irritated and bleeding for a while but it seems to be getting better.  She has another spot on the left neck/shoulder region that she thinks is a skin tag that frequently gets caught on her necklaces and bleeds.  She has a medical history significant for melanoma which was resected but 10 years later was found again in lymph nodes and again was resected.  She is 16 years out from the melanoma but notes that she is not faithful in getting full body skin exams with the dermatologist due to insurance conflicts.  She was being seen by Dr. Nevada Crane and now that her insurance is changed she is going to try and get a full exam but wanted to make sure that these were not concerning lesions.    ROS: Per HPI  No Known Allergies Past Medical History:  Diagnosis Date  . Cancer ([place]) 2004   melanoma   . Hyperlipemia     Current Outpatient Medications:  .  atorvastatin (LIPITOR) 80 MG tablet, TAKE 1 TABLET BY MOUTH EVERY DAY IN THE EVENING, Disp: 90 tablet, Rfl: 3 .  clobetasol cream (TEMOVATE) 0.05 %, Apply to the affected areas on vagina twice daily for 2 weeks.  Then use up to 3 times weekly if needed., Disp: 45 g, Rfl: 2 .  Multiple Vitamin (MULTIVITAMIN WITH MINERALS) TABS tablet, Take 1 tablet by mouth daily., Disp: , Rfl:  .  rOPINIRole (REQUIP) 0.25 MG tablet, Take 1 tablet (0.25 mg total) by mouth at bedtime., Disp: 90 tablet, Rfl: 2 Social History   Socioeconomic History  . Marital status: Married    Spouse name: Not on file  . Number of children: Not on file  . Years of education: Not on file  . Highest education level: Not on file  Occupational History  . Not on file  Social Needs  . Financial resource strain:  Not on file  . Food insecurity    Worry: Not on file    Inability: Not on file  . Transportation needs    Medical: Not on file    Non-medical: Not on file  Tobacco Use  . Smoking status: Never Smoker  . Smokeless tobacco: Never Used  Substance and Sexual Activity  . Alcohol use: No  . Drug use: No  . Sexual activity: Yes  Lifestyle  . Physical activity    Days per week: Not on file    Minutes per session: Not on file  . Stress: Not on file  Relationships  . Social Herbalist on phone: Not on file    Gets together: Not on file    Attends religious service: Not on file    Active member of club or organization: Not on file    Attends meetings of clubs or organizations: Not on file    Relationship status: Not on file  . Intimate partner violence    Fear of current or ex partner: Not on file    Emotionally abused: Not on file    Physically abused: Not on file    Forced sexual activity: Not on file  Other Topics Concern  . Not on file  Social History Narrative  .  Not on file   No family history on file.  Objective: Office vital signs reviewed. BP 137/78   Pulse 86   Temp 99.1 F (37.3 C) (Temporal)   Ht 5\' 1"  (1.549 m)   Wt 138 lb (62.6 kg)   SpO2 97%   BMI 26.07 kg/m   Physical Examination:  General: Awake, alert, well nourished, No acute distress Skin: small 18mm inflamed skin tag noted along the left neck/clavicle 55mmx5mm seborrheic keratosis noted along the right posterior shoulder. No bleeding/ inflammation. She has postsurgical scarring noted along the apex of the left shoulder and along the anterior left shoulder.  PROCEDURE NOTE: Skin tag removal Patient given informed consent, signed copy in the chart.  Appropriate time out taken. Areas of concern cleansed with alcohol swabs.  Areas numbed with ethyl chloride.  Once anaesthesia obtained, skin tag was removed using sterile scissors.  1 skin tags were removed in total.  The patient tolerated the  procedure well.  Minimal bleeding.  Patient given post procedure instructions.    Assessment/ Plan: 63 y.o. female   1. Seborrheic keratosis We discussed this is a benign lesion.  Because it is no longer irritating her she deferred any removal.  2. Inflamed skin tag Inflamed and she wanted to have this removed.  This was removed without difficulty.  No immediate complications.  Patient tolerated procedure well.  We discussed signs and symptoms of infection and reasons for return.  She voiced good understanding.  3. History of melanoma I have highly recommended that she go ahead and schedule a full body follow-up with her dermatologist.  She has multiple pigmented nevi that appear benign on her body.  However given history of melanoma would certainly warrant closer eval.   No orders of the defined types were placed in this encounter.  No orders of the defined types were placed in this encounter.    Janora Norlander, DO [place] [phone]

## 2018-10-07 ENCOUNTER — Other Ambulatory Visit: Payer: Self-pay | Admitting: Family Medicine

## 2018-10-07 DIAGNOSIS — G2581 Restless legs syndrome: Secondary | ICD-10-CM

## 2018-10-07 DIAGNOSIS — E785 Hyperlipidemia, unspecified: Secondary | ICD-10-CM

## 2018-10-07 MED ORDER — ATORVASTATIN CALCIUM 80 MG PO TABS: ORAL_TABLET | ORAL | 3 refills | Status: DC

## 2018-10-07 MED ORDER — ROPINIROLE HCL 0.25 MG PO TABS: 0.2500 mg | ORAL_TABLET | Freq: Every day | ORAL | 2 refills | Status: DC

## 2018-10-31 ENCOUNTER — Other Ambulatory Visit: Payer: Self-pay | Admitting: Family Medicine

## 2018-10-31 ENCOUNTER — Telehealth: Payer: Self-pay | Admitting: Family Medicine

## 2018-10-31 DIAGNOSIS — G2581 Restless legs syndrome: Secondary | ICD-10-CM

## 2018-10-31 MED ORDER — ROPINIROLE HCL 0.5 MG PO TABS: 0.5000 mg | ORAL_TABLET | Freq: Every day | ORAL | 3 refills | Status: DC

## 2018-10-31 NOTE — Telephone Encounter (Signed)
Please inform that I have sent for 0.5mg  so she doesn't have to take 2 at a time to equal dose.  Rx sent.

## 2018-10-31 NOTE — Telephone Encounter (Signed)
Pt aware.

## 2018-12-08 DIAGNOSIS — L84 Corns and callosities: Secondary | ICD-10-CM | POA: Diagnosis not present

## 2018-12-08 DIAGNOSIS — M79676 Pain in unspecified toe(s): Secondary | ICD-10-CM | POA: Diagnosis not present

## 2019-07-10 ENCOUNTER — Other Ambulatory Visit: Payer: Self-pay | Admitting: Family Medicine

## 2019-07-10 DIAGNOSIS — E785 Hyperlipidemia, unspecified: Secondary | ICD-10-CM

## 2019-07-11 NOTE — Telephone Encounter (Signed)
[facility]. NTBS LOV 07/20/19 mail order not sent

## 2019-07-11 NOTE — Telephone Encounter (Signed)
Aware needs appointment and will call back.

## 2019-07-26 ENCOUNTER — Other Ambulatory Visit: Payer: Self-pay | Admitting: Family Medicine

## 2019-07-26 DIAGNOSIS — E785 Hyperlipidemia, unspecified: Secondary | ICD-10-CM

## 2019-07-26 DIAGNOSIS — Z1231 Encounter for screening mammogram for malignant neoplasm of breast: Secondary | ICD-10-CM

## 2019-07-27 NOTE — Telephone Encounter (Signed)
Pt was made aware to schedule follow up when previous rx denied 07/10/19-pt never scheduled appt.

## 2019-08-03 ENCOUNTER — Telehealth: Payer: Self-pay | Admitting: Physical Medicine and Rehabilitation

## 2019-08-03 NOTE — Telephone Encounter (Signed)
Patient called. She would like an appointment with Dr. Ernestina Patches. Her call back number is [phone]

## 2019-08-04 NOTE — Telephone Encounter (Signed)
Pt called asking for a repeat injection, pt had Rt C7-T1 IL 09/26/2018. Ok to repeat to injection if no new injuries/traumas ? Please Advise.

## 2019-08-07 NOTE — Telephone Encounter (Signed)
ok 

## 2019-08-08 NOTE — Telephone Encounter (Signed)
Left message #1

## 2019-08-16 ENCOUNTER — Other Ambulatory Visit: Payer: Self-pay | Admitting: Family Medicine

## 2019-08-16 DIAGNOSIS — G2581 Restless legs syndrome: Secondary | ICD-10-CM

## 2019-08-16 NOTE — Telephone Encounter (Signed)
Is auth needed for [id number]?

## 2019-08-16 NOTE — Telephone Encounter (Signed)
Auth for [id number] isnt needed for this pt insurance.

## 2019-08-16 NOTE — Telephone Encounter (Signed)
Called a second time. Call cannot be completed at this time.

## 2019-08-17 NOTE — Telephone Encounter (Signed)
Appointment scheduled.

## 2019-08-17 NOTE — Telephone Encounter (Signed)
Pt was told 07/10/19 they need appt for refills and no appt scheduled  ntbs for refills

## 2019-08-18 NOTE — Telephone Encounter (Signed)
Called patient a third time. Call cannot be completed at this time. Closing encounter.

## 2019-08-23 ENCOUNTER — Ambulatory Visit
Admission: RE | Admit: 2019-08-23 | Discharge: 2019-08-23 | Disposition: A | Discharge status: Home / Self Care | Payer: Medicare Other | Source: Ambulatory Visit | Attending: Family Medicine | Admitting: Family Medicine

## 2019-08-23 ENCOUNTER — Other Ambulatory Visit: Payer: Self-pay

## 2019-08-23 DIAGNOSIS — Z1231 Encounter for screening mammogram for malignant neoplasm of breast: Secondary | ICD-10-CM | POA: Diagnosis not present

## 2019-08-28 ENCOUNTER — Telehealth: Payer: Self-pay | Admitting: Family Medicine

## 2019-08-28 NOTE — Telephone Encounter (Signed)
Patient aware that mammo has not been reviewed yet.  Patient seen the report on mychart and is concerned.

## 2019-08-28 NOTE — Telephone Encounter (Signed)
Patient aware and verbalized understanding. °

## 2019-08-28 NOTE — Telephone Encounter (Signed)
Lmtcb.

## 2019-08-28 NOTE — Telephone Encounter (Signed)
Please inform patient that this is simply denoting that her breast tissue is dense.  This typically occurs when the breasts have not yet atrophied due to age.  This is not necessarily an unusual finding.  Her actual scan showed a good checkup.  She was a BI-RADS 1 which means that she does not have any malignant processes going on.  Repeat in 1 year

## 2019-09-01 ENCOUNTER — Other Ambulatory Visit: Payer: Self-pay | Admitting: Family Medicine

## 2019-09-01 DIAGNOSIS — G2581 Restless legs syndrome: Secondary | ICD-10-CM

## 2019-09-20 ENCOUNTER — Ambulatory Visit (INDEPENDENT_AMBULATORY_CARE_PROVIDER_SITE_OTHER): Payer: Medicare Other | Admitting: Family Medicine

## 2019-09-20 ENCOUNTER — Other Ambulatory Visit: Payer: Self-pay

## 2019-09-20 ENCOUNTER — Encounter: Payer: Self-pay | Admitting: Family Medicine

## 2019-09-20 VITALS — BP 123/69 | HR 83 | Temp 97.8°F | Ht 61.0 in | Wt 153.0 lb

## 2019-09-20 DIAGNOSIS — G2581 Restless legs syndrome: Secondary | ICD-10-CM | POA: Diagnosis not present

## 2019-09-20 DIAGNOSIS — Z23 Encounter for immunization: Secondary | ICD-10-CM

## 2019-09-20 DIAGNOSIS — E781 Pure hyperglyceridemia: Secondary | ICD-10-CM

## 2019-09-20 DIAGNOSIS — E785 Hyperlipidemia, unspecified: Secondary | ICD-10-CM | POA: Diagnosis not present

## 2019-09-20 DIAGNOSIS — N904 Leukoplakia of vulva: Secondary | ICD-10-CM

## 2019-09-20 MED ORDER — ROPINIROLE HCL 0.5 MG PO TABS: 0.5000 mg | ORAL_TABLET | Freq: Every day | ORAL | 3 refills | Status: DC

## 2019-09-20 MED ORDER — ATORVASTATIN CALCIUM 80 MG PO TABS: ORAL_TABLET | ORAL | 3 refills | Status: DC

## 2019-09-20 NOTE — Progress Notes (Signed)
Subjective: CC: Routine checkup for restless leg, hyperlipidemia PCP: Janora Norlander, DO AJO:INOMVEHM Taylor Craig is a 64 y.o. female presenting to clinic today for:  1.  Restless leg syndrome Patient does have occasional breakthrough restless leg but for the most part her restless legs are very well controlled with Requip 0.5 mg at bedtime.  She does not report any adverse side effects.  2.  Hyperlipidemia Patient is compliant with Lipitor 80 mg daily.  She does need refills.  No chest pain, shortness breath, abdominal pain, nausea or vomiting.   ROS: Per HPI  No Known Allergies Past Medical History:  Diagnosis Date   Cancer ([place]) 2004   melanoma    Hyperlipemia     Current Outpatient Medications:    atorvastatin (LIPITOR) 80 MG tablet, TAKE 1 TABLET BY MOUTH EVERY DAY IN THE EVENING, Disp: 90 tablet, Rfl: 3   clobetasol cream (TEMOVATE) 0.05 %, Apply to the affected areas on vagina twice daily for 2 weeks.  Then use up to 3 times weekly if needed., Disp: 45 g, Rfl: 2   Multiple Vitamin (MULTIVITAMIN WITH MINERALS) TABS tablet, Take 1 tablet by mouth daily., Disp: , Rfl:    rOPINIRole (REQUIP) 0.5 MG tablet, TAKE 1 TABLET BY MOUTH AT  BEDTIME, Disp: 90 tablet, Rfl: 0 Social History   Socioeconomic History   Marital status: Married    Spouse name: Not on file   Number of children: Not on file   Years of education: Not on file   Highest education level: Not on file  Occupational History   Not on file  Tobacco Use   Smoking status: Never Smoker   Smokeless tobacco: Never Used  Vaping Use   Vaping Use: Never used  Substance and Sexual Activity   Alcohol use: No   Drug use: No   Sexual activity: Yes  Other Topics Concern   Not on file  Social History Narrative   Not on file   Social Determinants of Health   Financial Resource Strain:    Difficulty of Paying Living Expenses: Not on file  Food Insecurity:    Worried About Running Out of Food  in the Last Year: Not on file   Ran Out of Food in the Last Year: Not on file  Transportation Needs:    Lack of Transportation (Medical): Not on file   Lack of Transportation (Non-Medical): Not on file  Physical Activity:    Days of Exercise per Week: Not on file   Minutes of Exercise per Session: Not on file  Stress:    Feeling of Stress : Not on file  Social Connections:    Frequency of Communication with Friends and Family: Not on file   Frequency of Social Gatherings with Friends and Family: Not on file   Attends Religious Services: Not on file   Active Member of Clubs or Organizations: Not on file   Attends Archivist Meetings: Not on file   Marital Status: Not on file  Intimate Partner Violence:    Fear of Current or Ex-Partner: Not on file   Emotionally Abused: Not on file   Physically Abused: Not on file   Sexually Abused: Not on file   History reviewed. No pertinent family history.  Objective: Office vital signs reviewed. BP 123/69    Pulse 83    Temp 97.8 F (36.6 C)    Ht 5' 1"  (1.549 m)    Wt 153 lb (69.4 kg)  SpO2 98%    BMI 28.91 kg/m   Physical Examination:  General: Awake, alert, well nourished, No acute distress HEENT: Normal, sclera white, MMM Cardio: regular rate and rhythm, S1S2 heard, no murmurs appreciated Pulm: clear to auscultation bilaterally, no wheezes, rhonchi or rales; normal work of breathing on room air Extremities: warm, well perfused, No edema, cyanosis or clubbing; +2 pulses bilaterally MSK: normal gait and station  Assessment/ Plan: 64 y.o. female   1. Restless legs syndrome (RLS) Stable. - CBC; Future - rOPINIRole (REQUIP) 0.5 MG tablet; Take 1 tablet (0.5 mg total) by mouth at bedtime.  Dispense: 90 tablet; Refill: 3  2. Pure hypertriglyceridemia Continue statin.  Check fasting labs - CMP14+EGFR; Future - Lipid panel; Future - TSH; Future - atorvastatin (LIPITOR) 80 MG tablet; TAKE 1 TABLET BY MOUTH  EVERY DAY IN THE EVENING  Dispense: 90 tablet; Refill: 3  4. Need for pneumococcal vaccination Administered - Pneumococcal conjugate vaccine 13-valent  5. Lichen sclerosus of female genitalia Stable - clobetasol cream (TEMOVATE) 0.05 %; Apply to the affected areas on vagina twice daily for 2 weeks.  Then use up to 3 times weekly if needed.  Dispense: 45 g; Refill: 2   No orders of the defined types were placed in this encounter.  No orders of the defined types were placed in this encounter.    Janora Norlander, DO [place] [phone]

## 2019-09-22 MED ORDER — CLOBETASOL PROPIONATE 0.05 % EX CREA: TOPICAL_CREAM | CUTANEOUS | 2 refills | Status: DC

## 2019-10-06 ENCOUNTER — Other Ambulatory Visit: Payer: Medicare Other

## 2019-10-06 ENCOUNTER — Other Ambulatory Visit: Payer: Self-pay

## 2019-10-06 DIAGNOSIS — G2581 Restless legs syndrome: Secondary | ICD-10-CM | POA: Diagnosis not present

## 2019-10-06 DIAGNOSIS — E781 Pure hyperglyceridemia: Secondary | ICD-10-CM

## 2019-10-07 LAB — CMP14+EGFR
ALT: 35 IU/L — ABNORMAL HIGH (ref 0–32)
AST: 35 IU/L (ref 0–40)
Albumin/Globulin Ratio: 1.8 (ref 1.2–2.2)
Albumin: 4.4 g/dL (ref 3.8–4.8)
Alkaline Phosphatase: 80 IU/L (ref 48–121)
BUN/Creatinine Ratio: 18 (ref 12–28)
BUN: 13 mg/dL (ref 8–27)
Bilirubin Total: 0.3 mg/dL (ref 0.0–1.2)
CO2: 22 mmol/L (ref 20–29)
Calcium: 9.6 mg/dL (ref 8.7–10.3)
Chloride: 105 mmol/L (ref 96–106)
Creatinine, Ser: 0.72 mg/dL (ref 0.57–1.00)
GFR calc Af Amer: 102 mL/min/{1.73_m2} (ref >59)
GFR calc non Af Amer: 88 mL/min/{1.73_m2} (ref >59)
Globulin, Total: 2.5 g/dL (ref 1.5–4.5)
Glucose: 91 mg/dL (ref 65–99)
Potassium: 4.3 mmol/L (ref 3.5–5.2)
Sodium: 140 mmol/L (ref 134–144)
Total Protein: 6.9 g/dL (ref 6.0–8.5)

## 2019-10-07 LAB — LIPID PANEL
Chol/HDL Ratio: 2.5 ratio (ref 0.0–4.4)
Cholesterol, Total: 156 mg/dL (ref 100–199)
HDL: 63 mg/dL (ref >39)
LDL Chol Calc (NIH): 75 mg/dL (ref 0–99)
Triglycerides: 96 mg/dL (ref 0–149)
VLDL Cholesterol Cal: 18 mg/dL (ref 5–40)

## 2019-10-07 LAB — TSH: TSH: 1.86 u[IU]/mL (ref 0.450–4.500)

## 2019-10-07 LAB — CBC
Hematocrit: 42.4 % (ref 34.0–46.6)
Hemoglobin: 13.9 g/dL (ref 11.1–15.9)
MCH: 30.8 pg (ref 26.6–33.0)
MCHC: 32.8 g/dL (ref 31.5–35.7)
MCV: 94 fL (ref 79–97)
Platelets: 313 10*3/uL (ref 150–450)
RBC: 4.52 x10E6/uL (ref 3.77–5.28)
RDW: 12.1 % (ref 11.7–15.4)
WBC: 6.4 10*3/uL (ref 3.4–10.8)

## 2019-12-29 ENCOUNTER — Ambulatory Visit: Payer: Medicare Other

## 2020-01-11 ENCOUNTER — Ambulatory Visit (INDEPENDENT_AMBULATORY_CARE_PROVIDER_SITE_OTHER): Payer: Medicare Other

## 2020-01-11 ENCOUNTER — Other Ambulatory Visit: Payer: Self-pay

## 2020-01-11 DIAGNOSIS — Z23 Encounter for immunization: Secondary | ICD-10-CM

## 2020-03-26 ENCOUNTER — Telehealth: Payer: Self-pay | Admitting: Physical Medicine and Rehabilitation

## 2020-03-26 NOTE — Telephone Encounter (Signed)
See previous message

## 2020-03-26 NOTE — Telephone Encounter (Signed)
Pt would like an appt for her back   [phone]

## 2020-03-26 NOTE — Telephone Encounter (Signed)
Right C7-T1 IL on 09/26/2018. Patient reports that she had good relief for a long time following the last injection and pain has started to return. Please advise.

## 2020-03-26 NOTE — Telephone Encounter (Signed)
Left message #1. Need to find out id patient wants appointment for low back or neck. She has seen Dr. Ernestina Patches for neck injections, but not for low back.

## 2020-03-26 NOTE — Telephone Encounter (Signed)
Pt returned courtneys call and hung up before I could ask her neck or back. Please call her bacj [phone]

## 2020-03-27 NOTE — Telephone Encounter (Signed)
Called pt and sch 3/10

## 2020-03-27 NOTE — Telephone Encounter (Signed)
Pt not req Auth#. 

## 2020-03-27 NOTE — Telephone Encounter (Signed)
Can try to get pre-auth, if that is ok would do eval and injection same day

## 2020-03-27 NOTE — Telephone Encounter (Signed)
Is auth needed for repeat C7-T1 IL? Patient has not been scheduled.

## 2020-04-01 ENCOUNTER — Telehealth: Payer: Self-pay

## 2020-04-01 NOTE — Telephone Encounter (Signed)
Patient called she wants to know if Dr.Newton has any available appointments Wednesday due to her driver being able to bring her call [phone]

## 2020-04-01 NOTE — Telephone Encounter (Signed)
Called pt and informed her we don't have available appt on Wednesday.

## 2020-04-04 ENCOUNTER — Encounter: Payer: Self-pay | Admitting: Physical Medicine and Rehabilitation

## 2020-04-04 ENCOUNTER — Other Ambulatory Visit: Payer: Self-pay

## 2020-04-04 ENCOUNTER — Ambulatory Visit: Payer: Self-pay

## 2020-04-04 ENCOUNTER — Ambulatory Visit: Payer: Medicare Other | Admitting: Physical Medicine and Rehabilitation

## 2020-04-04 VITALS — BP 156/82 | HR 82

## 2020-04-04 DIAGNOSIS — M5412 Radiculopathy, cervical region: Secondary | ICD-10-CM

## 2020-04-04 DIAGNOSIS — M542 Cervicalgia: Secondary | ICD-10-CM | POA: Diagnosis not present

## 2020-04-04 DIAGNOSIS — G8929 Other chronic pain: Secondary | ICD-10-CM

## 2020-04-04 DIAGNOSIS — M4802 Spinal stenosis, cervical region: Secondary | ICD-10-CM

## 2020-04-04 DIAGNOSIS — M25512 Pain in left shoulder: Secondary | ICD-10-CM

## 2020-04-04 DIAGNOSIS — M25511 Pain in right shoulder: Secondary | ICD-10-CM | POA: Diagnosis not present

## 2020-04-04 DIAGNOSIS — M7918 Myalgia, other site: Secondary | ICD-10-CM | POA: Diagnosis not present

## 2020-04-04 MED ORDER — BETAMETHASONE SOD PHOS & ACET 6 (3-3) MG/ML IJ SUSP
12.0000 mg | Freq: Once | INTRAMUSCULAR | Status: AC
  Administered 2020-04-04: 12 mg
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
## 2020-04-04 NOTE — Procedures (Signed)
Cervical Epidural Steroid Injection - Interlaminar Approach with Fluoroscopic Guidance  Patient: Taylor Craig      Date of Birth: August 07, 1955 MRN: [id number] PCP: Janora Norlander, DO      Visit Date: 04/04/2020   Universal Protocol:    Date/Time: 04/05/2210:34 PM  Consent Given By: the patient  Position: PRONE  Additional Comments: Vital signs were monitored before and after the procedure. Patient was prepped and draped in the usual sterile fashion. The correct patient, procedure, and site was verified.   Injection Procedure Details:   Procedure diagnoses: Cervical radiculopathy [M54.12]    Meds Administered:  Meds ordered this encounter  Medications  . betamethasone acetate-betamethasone sodium phosphate (CELESTONE) injection 12 mg     Laterality: Left  Location/Site: C7-T1  Needle: 3.5 in., 20 ga. Tuohy  Needle Placement: Paramedian epidural space  Findings:  -Comments: Excellent flow of contrast into the epidural space.  Procedure Details: Using a paramedian approach from the side mentioned above, the region overlying the inferior lamina was localized under fluoroscopic visualization and the soft tissues overlying this structure were infiltrated with 4 ml. of 1% Lidocaine without Epinephrine. A # 20 gauge, Tuohy needle was inserted into the epidural space using a paramedian approach.  The epidural space was localized using loss of resistance along with contralateral oblique bi-planar fluoroscopic views.  After negative aspirate for air, blood, and CSF, a 2 ml. volume of Isovue-250 was injected into the epidural space and the flow of contrast was observed. Radiographs were obtained for documentation purposes.   The injectate was administered into the level noted above.  Additional Comments:  The patient tolerated the procedure well Dressing: 2 x 2 sterile gauze and Band-Aid    Post-procedure details: Patient was observed during the  procedure. Post-procedure instructions were reviewed.  Patient left the clinic in stable condition.

## 2020-04-04 NOTE — Patient Instructions (Signed)

## 2020-04-04 NOTE — Progress Notes (Signed)
Pt state neck pain that travels to both shoulders. Pt state lifting, pushing and pulling makes the pain worse. Pt state she take over the counter pain meds, rest and use heating pads.   Numeric Pain Rating Scale and Functional Assessment Average Pain 7   In the last MONTH (on 0-10 scale) has pain interfered with the following?  1. General activity like being  able to carry out your everyday physical activities such as walking, climbing stairs, carrying groceries, or moving a chair?  Rating(7)   +Driver, -BT, -Dye Allergies.

## 2020-07-19 ENCOUNTER — Other Ambulatory Visit: Payer: Self-pay | Admitting: Family Medicine

## 2020-07-19 DIAGNOSIS — Z1231 Encounter for screening mammogram for malignant neoplasm of breast: Secondary | ICD-10-CM

## 2020-07-27 ENCOUNTER — Other Ambulatory Visit: Payer: Self-pay | Admitting: Family Medicine

## 2020-07-27 DIAGNOSIS — E781 Pure hyperglyceridemia: Secondary | ICD-10-CM

## 2020-08-09 ENCOUNTER — Ambulatory Visit: Payer: Medicare Other

## 2020-08-13 ENCOUNTER — Ambulatory Visit: Payer: Medicare Other

## 2020-08-15 ENCOUNTER — Ambulatory Visit (INDEPENDENT_AMBULATORY_CARE_PROVIDER_SITE_OTHER): Payer: Medicare Other

## 2020-08-15 VITALS — Ht 61.0 in | Wt 153.0 lb

## 2020-08-15 DIAGNOSIS — Z0000 Encounter for general adult medical examination without abnormal findings: Secondary | ICD-10-CM

## 2020-08-15 NOTE — Patient Instructions (Signed)
Taylor Craig , Thank you for taking time to come for your Medicare Wellness Visit. I appreciate your ongoing commitment to your health goals. Please review the following plan we discussed and let me know if I can assist you in the future.   Screening recommendations/referrals: Colonoscopy: Cologuard done 08/03/2018 - Repeat every 3 years  Mammogram: Done 08/23/2019 - Repeat annually (Keep appointment 09/02/20) Bone Density: Due (every 2-5 years) Recommended yearly ophthalmology/optometry visit for glaucoma screening and checkup Recommended yearly dental visit for hygiene and checkup  Vaccinations: Influenza vaccine: Done 01/11/2020 - Repeat annually  Pneumococcal vaccine: [id number]-02 Done 09/20/2019; Due for Pneumovax-23 Tdap vaccine: Done 07/27/2016 - Repeat in 10 years  Shingles vaccine: Due. Shingrix discussed. Please contact your pharmacy for coverage information.     Covid-19:Done 04/06/19, 04/29/19, & 12/28/2019  Advanced directives: Please bring a copy of your health care power of attorney and living will to the office to be added to your chart at your convenience.   Conditions/risks identified: Aim for 30 minutes of exercise or brisk walking each day, drink 6-8 glasses of water and eat lots of fruits and vegetables.   Next appointment: Follow up in one year for your annual wellness visit    Preventive Care 65 Years and Older, Female Preventive care refers to lifestyle choices and visits with your health care provider that can promote health and wellness. What does preventive care include? A yearly physical exam. This is also called an annual well check. Dental exams once or twice a year. Routine eye exams. Ask your health care provider how often you should have your eyes checked. Personal lifestyle choices, including: Daily care of your teeth and gums. Regular physical activity. Eating a healthy diet. Avoiding tobacco and drug use. Limiting alcohol use. Practicing safe sex. Taking  low-dose aspirin every day. Taking vitamin and mineral supplements as recommended by your health care provider. What happens during an annual well check? The services and screenings done by your health care provider during your annual well check will depend on your age, overall health, lifestyle risk factors, and family history of disease. Counseling  Your health care provider may ask you questions about your: Alcohol use. Tobacco use. Drug use. Emotional well-being. Home and relationship well-being. Sexual activity. Eating habits. History of falls. Memory and ability to understand (cognition). Work and work Statistician. Reproductive health. Screening  You may have the following tests or measurements: Height, weight, and BMI. Blood pressure. Lipid and cholesterol levels. These may be checked every 5 years, or more frequently if you are over 19 years old. Skin check. Lung cancer screening. You may have this screening every year starting at age 83 if you have a 30-pack-year history of smoking and currently smoke or have quit within the past 15 years. Fecal occult blood test (FOBT) of the stool. You may have this test every year starting at age 53. Flexible sigmoidoscopy or colonoscopy. You may have a sigmoidoscopy every 5 years or a colonoscopy every 10 years starting at age 72. Hepatitis C blood test. Hepatitis B blood test. Sexually transmitted disease (STD) testing. Diabetes screening. This is done by checking your blood sugar (glucose) after you have not eaten for a while (fasting). You may have this done every 1-3 years. Bone density scan. This is done to screen for osteoporosis. You may have this done starting at age 72. Mammogram. This may be done every 1-2 years. Talk to your health care provider about how often you should have regular mammograms. Talk  with your health care provider about your test results, treatment options, and if necessary, the need for more tests. Vaccines   Your health care provider may recommend certain vaccines, such as: Influenza vaccine. This is recommended every year. Tetanus, diphtheria, and acellular pertussis (Tdap, Td) vaccine. You may need a Td booster every 10 years. Zoster vaccine. You may need this after age 97. Pneumococcal 13-valent conjugate (PCV13) vaccine. One dose is recommended after age 39. Pneumococcal polysaccharide (PPSV23) vaccine. One dose is recommended after age 64. Talk to your health care provider about which screenings and vaccines you need and how often you need them. This information is not intended to replace advice given to you by your health care provider. Make sure you discuss any questions you have with your health care provider. Document Released: 02/08/2015 Document Revised: 10/02/2015 Document Reviewed: 11/13/2014 Elsevier Interactive Patient Education  2017 [employer] Prevention in the Home Falls can cause injuries. They can happen to people of all ages. There are many things you can do to make your home safe and to help prevent falls. What can I do on the outside of my home? Regularly fix the edges of walkways and driveways and fix any cracks. Remove anything that might make you trip as you walk through a door, such as a raised step or threshold. Trim any bushes or trees on the path to your home. Use bright outdoor lighting. Clear any walking paths of anything that might make someone trip, such as rocks or tools. Regularly check to see if handrails are loose or broken. Make sure that both sides of any steps have handrails. Any raised decks and porches should have guardrails on the edges. Have any leaves, snow, or ice cleared regularly. Use sand or salt on walking paths during winter. Clean up any spills in your garage right away. This includes oil or grease spills. What can I do in the bathroom? Use night lights. Install grab bars by the toilet and in the tub and shower. Do not use towel  bars as grab bars. Use non-skid mats or decals in the tub or shower. If you need to sit down in the shower, use a plastic, non-slip stool. Keep the floor dry. Clean up any water that spills on the floor as soon as it happens. Remove soap buildup in the tub or shower regularly. Attach bath mats securely with double-sided non-slip rug tape. Do not have throw rugs and other things on the floor that can make you trip. What can I do in the bedroom? Use night lights. Make sure that you have a light by your bed that is easy to reach. Do not use any sheets or blankets that are too big for your bed. They should not hang down onto the floor. Have a firm chair that has side arms. You can use this for support while you get dressed. Do not have throw rugs and other things on the floor that can make you trip. What can I do in the kitchen? Clean up any spills right away. Avoid walking on wet floors. Keep items that you use a lot in easy-to-reach places. If you need to reach something above you, use a strong step stool that has a grab bar. Keep electrical cords out of the way. Do not use floor polish or wax that makes floors slippery. If you must use wax, use non-skid floor wax. Do not have throw rugs and other things on the floor that can make you  trip. What can I do with my stairs? Do not leave any items on the stairs. Make sure that there are handrails on both sides of the stairs and use them. Fix handrails that are broken or loose. Make sure that handrails are as long as the stairways. Check any carpeting to make sure that it is firmly attached to the stairs. Fix any carpet that is loose or worn. Avoid having throw rugs at the top or bottom of the stairs. If you do have throw rugs, attach them to the floor with carpet tape. Make sure that you have a light switch at the top of the stairs and the bottom of the stairs. If you do not have them, ask someone to add them for you. What else can I do to help  prevent falls? Wear shoes that: Do not have high heels. Have rubber bottoms. Are comfortable and fit you well. Are closed at the toe. Do not wear sandals. If you use a stepladder: Make sure that it is fully opened. Do not climb a closed stepladder. Make sure that both sides of the stepladder are locked into place. Ask someone to hold it for you, if possible. Clearly mark and make sure that you can see: Any grab bars or handrails. First and last steps. Where the edge of each step is. Use tools that help you move around (mobility aids) if they are needed. These include: Canes. Walkers. Scooters. Crutches. Turn on the lights when you go into a dark area. Replace any light bulbs as soon as they burn out. Set up your furniture so you have a clear path. Avoid moving your furniture around. If any of your floors are uneven, fix them. If there are any pets around you, be aware of where they are. Review your medicines with your doctor. Some medicines can make you feel dizzy. This can increase your chance of falling. Ask your doctor what other things that you can do to help prevent falls. This information is not intended to replace advice given to you by your health care provider. Make sure you discuss any questions you have with your health care provider. Document Released: 11/08/2008 Document Revised: 06/20/2015 Document Reviewed: 02/16/2014 Elsevier Interactive Patient Education  2017 [employer].

## 2020-08-15 NOTE — Progress Notes (Signed)
Subjective:   Taylor Craig is a 65 y.o. female who presents for an Initial Medicare Annual Wellness Visit.  Virtual Visit via Telephone Note  I connected with  [name] on 08/15/20 at  4:15 PM EDT by telephone and verified that I am speaking with the correct person using two identifiers.  Location: Patient: Home  Provider: [facility] Persons participating in the virtual visit: patient/Nurse Health Advisor   I discussed the limitations, risks, security and privacy concerns of performing an evaluation and management service by telephone and the availability of in person appointments. The patient expressed understanding and agreed to proceed.  Interactive audio and video telecommunications were attempted between this nurse and patient, however failed, due to patient having technical difficulties OR patient did not have access to video capability.  We continued and completed visit with audio only.  Some vital signs may be absent or patient reported.   Deretha Ertle E Merelin Human, LPN   Review of Systems     Cardiac Risk Factors include: advanced age (>35men, >40 women);dyslipidemia     Objective:    Today's Vitals   08/15/20 1619  Weight: 153 lb (69.4 kg)  Height: 5\' 1"  (1.549 m)   Body mass index is 28.91 kg/m.  Advanced Directives 08/15/2020 08/23/2014 07/16/2014  Does Patient Have a Medical Advance Directive? No No No  Would patient like information on creating a medical advance directive? No - Patient declined - No - patient declined information    Current Medications (verified) Outpatient Encounter Medications as of 08/15/2020  Medication Sig   atorvastatin (LIPITOR) 80 MG tablet TAKE 1 TABLET BY MOUTH EVERY DAY IN THE EVENING   cyanocobalamin 1000 MCG tablet Take 1,000 mcg by mouth daily.   Multiple Vitamin (MULTIVITAMIN WITH MINERALS) TABS tablet Take 1 tablet by mouth daily.   rOPINIRole (REQUIP) 0.5 MG tablet Take 1 tablet (0.5 mg total) by mouth at bedtime.   clobetasol  cream (TEMOVATE) 0.05 % Apply to the affected areas on vagina twice daily for 2 weeks.  Then use up to 3 times weekly if needed. (Patient not taking: Reported on 08/15/2020)   No facility-administered encounter medications on file as of 08/15/2020.    Allergies (verified) Patient has no known allergies.   History: Past Medical History:  Diagnosis Date   Cancer ([place]) 2004   melanoma    Hyperlipemia    Past Surgical History:  Procedure Laterality Date   ABDOMINAL HYSTERECTOMY     BREAST EXCISIONAL BIOPSY Left    SKIN CANCER EXCISION     History reviewed. No pertinent family history. Social History   Socioeconomic History   Marital status: Married    Spouse name: Abe People   Number of children: 2   Years of education: Not on file   Highest education level: Not on file  Occupational History   Occupation: retired  Tobacco Use   Smoking status: Never   Smokeless tobacco: Never  Vaping Use   Vaping Use: Never used  Substance and Sexual Activity   Alcohol use: No   Drug use: No   Sexual activity: Yes  Other Topics Concern   Not on file  Social History Narrative   2 sons - one lives with them   Social Determinants of Health   Financial Resource Strain: Low Risk    Difficulty of Paying Living Expenses: Not hard at all  Food Insecurity: No Food Insecurity   Worried About Charity fundraiser in the Last Year: Never true  Ran Out of Food in the Last Year: Never true  Transportation Needs: No Transportation Needs   Lack of Transportation (Medical): No   Lack of Transportation (Non-Medical): No  Physical Activity: Sufficiently Active   Days of Exercise per Week: 7 days   Minutes of Exercise per Session: 30 min  Stress: No Stress Concern Present   Feeling of Stress : Not at all  Social Connections: Socially Integrated   Frequency of Communication with Friends and Family: More than three times a week   Frequency of Social Gatherings with Friends and Family: More than three  times a week   Attends Religious Services: More than 4 times per year   Active Member of [employer] or Organizations: Yes   Attends Music therapist: More than 4 times per year   Marital Status: Married    Tobacco Counseling Counseling given: Not Answered   Clinical Intake:  Pre-visit preparation completed: Yes  Pain : No/denies pain     BMI - recorded: 28.91 Nutritional Status: BMI 25 -29 Overweight Nutritional Risks: None Diabetes: No  How often do you need to have someone help you when you read instructions, pamphlets, or other written materials from your doctor or pharmacy?: 1 - Never  Diabetic? No  Interpreter Needed?: No  Information entered by :: Jasmine Maceachern, LPN   Activities of Daily Living In your present state of health, do you have any difficulty performing the following activities: 08/15/2020  Hearing? N  Vision? N  Difficulty concentrating or making decisions? N  Walking or climbing stairs? N  Dressing or bathing? N  Doing errands, shopping? N  Preparing Food and eating ? N  Using the Toilet? N  In the past six months, have you accidently leaked urine? N  Do you have problems with loss of bowel control? N  Managing your Medications? N  Managing your Finances? N  Housekeeping or managing your Housekeeping? N  Some recent data might be hidden    Patient Care Team: Janora Norlander, DO as PCP - General (Family Medicine)  Indicate any recent Medical Services you may have received from other than [facility] providers in the past year (date may be approximate).     Assessment:   This is a routine wellness examination for [facility].  Hearing/Vision screen Hearing Screening - Comments:: Denies hearing difficulties  Vision Screening - Comments:: Wears eyeglasses - up to date with annual eye exams at [facility] in [place] issues and exercise activities discussed: Current Exercise Habits: Home exercise routine, Type of exercise: walking,  Time (Minutes): 30, Frequency (Times/Week): 7, Weekly Exercise (Minutes/Week): 210, Intensity: Mild, Exercise limited by: None identified   Goals Addressed             This Visit's Progress    Exercise 3x per week (30 min per time)         Depression Screen PHQ 2/9 Scores 08/15/2020 09/20/2019 09/30/2018 09/13/2018 07/20/2018 09/22/2017 07/01/2017  PHQ - 2 Score 0 0 0 0 0 0 0  PHQ- 9 Score - - 0 - 0 - -    Fall Risk Fall Risk  08/15/2020 09/20/2019 09/13/2018 07/20/2018 09/22/2017  Falls in the past year? 0 0 0 0 No  Number falls in past yr: 0 - - - -  Injury with Fall? 0 - - - -  Risk for fall due to : No Fall Risks - - - -  Follow up Falls prevention discussed - - - -  FALL RISK PREVENTION PERTAINING TO THE HOME:  Any stairs in or around the home? Yes  If so, are there any without handrails? No  Home free of loose throw rugs in walkways, pet beds, electrical cords, etc? Yes  Adequate lighting in your home to reduce risk of falls? No   ASSISTIVE DEVICES UTILIZED TO PREVENT FALLS:  Life alert? No  Use of a cane, walker or w/c? No  Grab bars in the bathroom? Yes  Shower chair or bench in shower? Yes  Elevated toilet seat or a handicapped toilet? Yes   TIMED UP AND GO:  Was the test performed? No . Telephonic visit  Cognitive Function:     6CIT Screen 08/15/2020  What Year? 0 points  What month? 0 points  What time? 0 points  Count back from 20 0 points  Months in reverse 0 points  Repeat phrase 0 points  Total Score 0    Immunizations Immunization History  Administered Date(s) Administered   Fluad Quad(high Dose 65+) 01/11/2020   Influenza,inj,Quad PF,6+ Mos 11/16/2012, 11/27/2014, 11/25/2015, 10/22/2016, 10/28/2017, 09/30/2018   Influenza-Unspecified 11/16/2012   Moderna Sars-Covid-2 Vaccination 04/06/2019, 04/29/2019, 12/28/2019   Pneumococcal Conjugate-13 09/20/2019   Tdap 07/27/2016    TDAP status: Up to date  Flu Vaccine status: Up to  date  Pneumococcal vaccine status: Due, Education has been provided regarding the importance of this vaccine. Advised may receive this vaccine at local pharmacy or Health Dept. Aware to provide a copy of the vaccination record if obtained from local pharmacy or Health Dept. Verbalized acceptance and understanding.  Covid-19 vaccine status: Completed vaccines  Qualifies for Shingles Vaccine? Yes   Zostavax completed No   Shingrix Completed?: No.    Education has been provided regarding the importance of this vaccine. Patient has been advised to call insurance company to determine out of pocket expense if they have not yet received this vaccine. Advised may also receive vaccine at local pharmacy or Health Dept. Verbalized acceptance and understanding.  Screening Tests Health Maintenance  Topic Date Due   Zoster Vaccines- Shingrix (1 of 2) Never done   DEXA SCAN  03/27/2019   COVID-19 Vaccine (4 - Booster) 03/27/2020   INFLUENZA VACCINE  08/26/2020   PNA vac Low Risk Adult (2 of 2 - PPSV23) 09/19/2020   Fecal DNA (Cologuard)  08/02/2021   MAMMOGRAM  08/22/2021   TETANUS/TDAP  07/28/2026   Hepatitis C Screening  Completed   HPV VACCINES  Aged Out    Health Maintenance  Health Maintenance Due  Topic Date Due   Zoster Vaccines- Shingrix (1 of 2) Never done   DEXA SCAN  03/27/2019   COVID-19 Vaccine (4 - Booster) 03/27/2020    Colorectal cancer screening: Type of screening: Cologuard. Completed 08/03/2018. Repeat every 3 years  Mammogram status: Completed 08/23/2019. Repeat every year Has appt 09/02/2020  Bone Density status: Ordered 08/15/2020. Pt provided with contact info and advised to call to schedule appt.  Lung Cancer Screening: (Low Dose CT Chest recommended if Age 37-80 years, 30 pack-year currently smoking OR have quit w/in 15years.) does not qualify.   Additional Screening:  Hepatitis C Screening: does qualify; Completed 07/27/2016  Vision Screening: Recommended annual  ophthalmology exams for early detection of glaucoma and other disorders of the eye. Is the patient up to date with their annual eye exam?  Yes  Who is the provider or what is the name of the office in which the patient attends annual eye exams? [facility]  If pt is not established with a provider, would they like to be referred to a provider to establish care? No .   Dental Screening: Recommended annual dental exams for proper oral hygiene  Community Resource Referral / Chronic Care Management: CRR required this visit?  No   CCM required this visit?  No      Plan:     I have personally reviewed and noted the following in the patient's chart:   Medical and social history Use of alcohol, tobacco or illicit drugs  Current medications and supplements including opioid prescriptions. Patient is not currently taking opioid prescriptions. Functional ability and status Nutritional status Physical activity Advanced directives List of other physicians Hospitalizations, surgeries, and ER visits in previous 12 months Vitals Screenings to include cognitive, depression, and falls Referrals and appointments  In addition, I have reviewed and discussed with patient certain preventive protocols, quality metrics, and best practice recommendations. A written personalized care plan for preventive services as well as general preventive health recommendations were provided to patient.     Sandrea Hammond, LPN   [id number]   Nurse Notes: None

## 2020-08-27 ENCOUNTER — Telehealth: Payer: Self-pay | Admitting: Physical Medicine and Rehabilitation

## 2020-08-27 NOTE — Telephone Encounter (Signed)
Left C7-T1 IL on 04/04/20. Ok to repeat if helped, same problem/side, and no new injury?

## 2020-08-27 NOTE — Telephone Encounter (Signed)
Pt called to schedule appt   CB [phone]

## 2020-08-28 ENCOUNTER — Telehealth: Payer: Self-pay | Admitting: Physical Medicine and Rehabilitation

## 2020-08-28 NOTE — Telephone Encounter (Signed)
Pt called back about appt. Pt asked for Taylor Craig to call her on her cell at [phone] to set an appt. Please call pt about this matter.

## 2020-08-28 NOTE — Telephone Encounter (Signed)
See previous message

## 2020-08-28 NOTE — Telephone Encounter (Signed)
Called patient to discuss/ schedule. Busy signal.

## 2020-08-28 NOTE — Telephone Encounter (Signed)
Is auth needed for Left C7-T1 IL? Scheduled for 8/16 with driver and no blood thinners.

## 2020-09-02 ENCOUNTER — Ambulatory Visit
Admission: RE | Admit: 2020-09-02 | Discharge: 2020-09-02 | Disposition: A | Discharge status: Home / Self Care | Payer: Medicare Other | Source: Ambulatory Visit | Attending: Family Medicine | Admitting: Family Medicine

## 2020-09-02 ENCOUNTER — Other Ambulatory Visit: Payer: Self-pay

## 2020-09-02 DIAGNOSIS — Z1231 Encounter for screening mammogram for malignant neoplasm of breast: Secondary | ICD-10-CM | POA: Diagnosis not present

## 2020-09-11 ENCOUNTER — Ambulatory Visit: Payer: Self-pay

## 2020-09-11 ENCOUNTER — Other Ambulatory Visit: Payer: Self-pay | Admitting: Family Medicine

## 2020-09-11 ENCOUNTER — Ambulatory Visit (INDEPENDENT_AMBULATORY_CARE_PROVIDER_SITE_OTHER): Payer: Medicare Other | Admitting: Physical Medicine and Rehabilitation

## 2020-09-11 ENCOUNTER — Other Ambulatory Visit: Payer: Self-pay

## 2020-09-11 ENCOUNTER — Encounter: Payer: Self-pay | Admitting: Physical Medicine and Rehabilitation

## 2020-09-11 VITALS — BP 144/84 | HR 82

## 2020-09-11 DIAGNOSIS — M5412 Radiculopathy, cervical region: Secondary | ICD-10-CM

## 2020-09-11 DIAGNOSIS — G2581 Restless legs syndrome: Secondary | ICD-10-CM

## 2020-09-11 DIAGNOSIS — M4802 Spinal stenosis, cervical region: Secondary | ICD-10-CM

## 2020-09-11 MED ORDER — BETAMETHASONE SOD PHOS & ACET 6 (3-3) MG/ML IJ SUSP
12.0000 mg | Freq: Once | INTRAMUSCULAR | Status: AC
  Administered 2020-09-11: 12 mg

## 2020-09-11 NOTE — Progress Notes (Signed)
Pt state neck pain that travels to both shoulder. Pt state turning her head and bending over the pain worse. Pt state lifting makes the pain worse. Pt state she takes over the counter pain meds and heating to help ease her pain. Pt has hx of inj on 04/04/20 pt state it helped.  Numeric Pain Rating Scale and Functional Assessment Average Pain 6   In the last MONTH (on 0-10 scale) has pain interfered with the following?  1. General activity like being  able to carry out your everyday physical activities such as walking, climbing stairs, carrying groceries, or moving a chair?  Rating(10)   +Driver, -BT, -Dye Allergies.

## 2020-09-11 NOTE — Patient Instructions (Signed)

## 2020-09-11 NOTE — Progress Notes (Signed)
[name] - 65 y.o. female MRN [id number]  Date of birth: [birth year]  Office Visit Note: Visit Date: 09/11/2020 PCP: Janora Norlander, DO Referred by: Janora Norlander, DO  Subjective: Chief Complaint  Patient presents with   Neck - Pain   Right Shoulder - Pain   Left Shoulder - Pain   HPI:  Taylor Craig is a 65 y.o. female who comes in today for planned repeat Left C7-T1  Cervical Interlaminar epidural steroid injection with fluoroscopic guidance.  The patient has failed conservative care including home exercise, medications, time and activity modification.  This injection will be diagnostic and hopefully therapeutic.  Please see requesting physician notes for further details and justification. Patient received more than 50% pain relief from prior injection.  Depending on relief consider updated cervical MRI.  Referring:  Dr. Assunta Found  ROS Otherwise per HPI.  Assessment & Plan: Visit Diagnoses:    ICD-10-CM   1. Cervical radiculopathy  M54.12 XR C-ARM NO REPORT    Epidural Steroid injection    betamethasone acetate-betamethasone sodium phosphate (CELESTONE) injection 12 mg    2. Spinal stenosis of cervical region  M48.02 XR C-ARM NO REPORT    Epidural Steroid injection    betamethasone acetate-betamethasone sodium phosphate (CELESTONE) injection 12 mg      Plan: No additional findings.   Meds & Orders:  Meds ordered this encounter  Medications   betamethasone acetate-betamethasone sodium phosphate (CELESTONE) injection 12 mg    Orders Placed This Encounter  Procedures   XR C-ARM NO REPORT   Epidural Steroid injection    Follow-up: No follow-ups on file.   Procedures: No procedures performed  Cervical Epidural Steroid Injection - Interlaminar Approach with Fluoroscopic Guidance  Patient: Taylor Craig      Date of Birth: 1955-08-05 MRN: [id number] PCP: Janora Norlander, DO      Visit Date: 09/11/2020   Universal Protocol:     Date/Time: 09/06/225:03 AM  Consent Given By: the patient  Position: PRONE  Additional Comments: Vital signs were monitored before and after the procedure. Patient was prepped and draped in the usual sterile fashion. The correct patient, procedure, and site was verified.   Injection Procedure Details:   Procedure diagnoses: Cervical radiculopathy [M54.12]    Meds Administered:  Meds ordered this encounter  Medications   betamethasone acetate-betamethasone sodium phosphate (CELESTONE) injection 12 mg     Laterality: Left  Location/Site: C7-T1  Needle: 3.5 in., 20 ga. Tuohy  Needle Placement: Paramedian epidural space  Findings:  -Comments: Excellent flow of contrast into the epidural space.  Procedure Details: Using a paramedian approach from the side mentioned above, the region overlying the inferior lamina was localized under fluoroscopic visualization and the soft tissues overlying this structure were infiltrated with 4 ml. of 1% Lidocaine without Epinephrine. A # 20 gauge, Tuohy needle was inserted into the epidural space using a paramedian approach.  The epidural space was localized using loss of resistance along with contralateral oblique bi-planar fluoroscopic views.  After negative aspirate for air, blood, and CSF, a 2 ml. volume of Isovue-250 was injected into the epidural space and the flow of contrast was observed. Radiographs were obtained for documentation purposes.   The injectate was administered into the level noted above.  Additional Comments:  The patient tolerated the procedure well Dressing: 2 x 2 sterile gauze and Band-Aid    Post-procedure details: Patient was observed during the procedure. Post-procedure instructions were reviewed.  Patient left the  clinic in stable condition.   Clinical History: MRI CERVICAL SPINE WITHOUT CONTRAST   TECHNIQUE: Multiplanar, multisequence MR imaging of the cervical spine was performed. No intravenous  contrast was administered.   COMPARISON:  07/16/2014   FINDINGS: The craniocervical junction appears unremarkable. There is 1.5 mm degenerative anterolisthesis at C2-3. Type 2 degenerative endplate findings at 075-GRM.   Disc desiccation noted throughout cervical spine with loss of disc height especially at C4-5, C5-6, and C6-7.   No significant abnormal spinal cord signal is observed. No significant vertebral marrow edema is identified. Additional findings at individual levels are as follows:   C2-3:  Unremarkable.   C3-4: Mild right foraminal stenosis and borderline central narrowing of the thecal sac due to uncinate spurring and disc bulge.   C4-5: Mild bilateral foraminal stenosis and borderline central narrowing of the thecal sac due to uncinate spurring and disc bulge.   C5-6: Moderate bilateral foraminal stenosis and mild right eccentric central narrowing of the thecal sac due to intervertebral spurring and disc bulge.   C6-7: Mild bilateral foraminal stenosis and borderline central narrowing of the thecal sac due to disc bulge.   C7-T1:  Unremarkable.   IMPRESSION: 1. Cervical spondylosis and degenerative disc disease, causing moderate impingement at C5-6 and mild impingement at C3-4, C4-5, and C6-7, as detailed above.     Electronically Signed   By: Van Clines M.D.   On: 07/16/2014 14:35     Objective:  VS:  HT:    WT:   BMI:     BP:(!) 144/84  HR:82bpm  TEMP: ( )  RESP:  Physical Exam Vitals and nursing note reviewed.  Constitutional:      General: She is not in acute distress.    Appearance: Normal appearance. She is not ill-appearing.  HENT:     Head: Normocephalic and atraumatic.     Right Ear: External ear normal.     Left Ear: External ear normal.  Eyes:     Extraocular Movements: Extraocular movements intact.  Cardiovascular:     Rate and Rhythm: Normal rate.     Pulses: Normal pulses.  Musculoskeletal:     Cervical back:  Tenderness present. No rigidity.     Right lower leg: No edema.     Left lower leg: No edema.     Comments: Patient has good strength in the upper extremities including 5 out of 5 strength in wrist extension long finger flexion and APB.  There is no atrophy of the hands intrinsically.  There is a negative Hoffmann's test.   Lymphadenopathy:     Cervical: No cervical adenopathy.  Skin:    Findings: No erythema, lesion or rash.  Neurological:     General: No focal deficit present.     Mental Status: She is alert and oriented to person, place, and time.     Sensory: No sensory deficit.     Motor: No weakness or abnormal muscle tone.     Coordination: Coordination normal.  Psychiatric:        Mood and Affect: Mood normal.        Behavior: Behavior normal.     Imaging: No results found.

## 2020-09-25 ENCOUNTER — Ambulatory Visit (INDEPENDENT_AMBULATORY_CARE_PROVIDER_SITE_OTHER): Payer: Medicare Other | Admitting: Family Medicine

## 2020-09-25 ENCOUNTER — Other Ambulatory Visit: Payer: Self-pay

## 2020-09-25 ENCOUNTER — Encounter: Payer: Self-pay | Admitting: Family Medicine

## 2020-09-25 VITALS — BP 120/69 | HR 86 | Temp 97.1°F | Resp 20 | Ht 61.0 in | Wt 157.0 lb

## 2020-09-25 DIAGNOSIS — Z0000 Encounter for general adult medical examination without abnormal findings: Secondary | ICD-10-CM

## 2020-09-25 DIAGNOSIS — Z85820 Personal history of malignant melanoma of skin: Secondary | ICD-10-CM | POA: Diagnosis not present

## 2020-09-25 DIAGNOSIS — N904 Leukoplakia of vulva: Secondary | ICD-10-CM | POA: Diagnosis not present

## 2020-09-25 DIAGNOSIS — E781 Pure hyperglyceridemia: Secondary | ICD-10-CM | POA: Diagnosis not present

## 2020-09-25 DIAGNOSIS — Z0001 Encounter for general adult medical examination with abnormal findings: Secondary | ICD-10-CM

## 2020-09-25 DIAGNOSIS — G2581 Restless legs syndrome: Secondary | ICD-10-CM

## 2020-09-25 DIAGNOSIS — Z23 Encounter for immunization: Secondary | ICD-10-CM | POA: Diagnosis not present

## 2020-09-25 MED ORDER — ATORVASTATIN CALCIUM 80 MG PO TABS: ORAL_TABLET | ORAL | 3 refills | Status: DC

## 2020-09-25 MED ORDER — ROPINIROLE HCL 0.5 MG PO TABS: 0.5000 mg | ORAL_TABLET | Freq: Every day | ORAL | 3 refills | Status: DC

## 2020-09-25 NOTE — Progress Notes (Signed)
Taylor Craig is a 65 y.o. female presents to office today for annual physical exam examination.    Concerns today include: 1.  None.  Just needs refills on medications.  Occupation: Retired,  Substance use: None Diet: Balanced, Exercise: Tries to stay active.  Her son is currently living with her after having been hospitalized in the ICU due to cardiogenic shock in the setting of infection and uncontrolled diabetes.  She is dressing his wounds Last eye exam: Up-to-date Last dental exam: Up-to-date Last colonoscopy: Up-to-date Last mammogram: Up-to-date Last pap smear: Up-to-date Refills needed today: Lipitor and Requip Immunizations needed: Immunization History  Administered Date(s) Administered   Fluad Quad(high Dose 65+) 01/11/2020   Influenza,inj,Quad PF,6+ Mos 11/16/2012, 11/27/2014, 11/25/2015, 10/22/2016, 10/28/2017, 09/30/2018   Influenza-Unspecified 11/16/2012   Moderna Sars-Covid-2 Vaccination 04/06/2019, 04/29/2019, 12/28/2019   Pneumococcal Conjugate-13 09/20/2019   Pneumococcal Polysaccharide-23 09/25/2020   Tdap 07/27/2016     Past Medical History:  Diagnosis Date   Cancer (Taylor Craig) 2004   melanoma    Hyperlipemia    Social History   Socioeconomic History   Marital status: Married    Spouse name: Taylor Craig   Number of children: 2   Years of education: Not on file   Highest education level: Not on file  Occupational History   Occupation: retired  Tobacco Use   Smoking status: Never   Smokeless tobacco: Never  Vaping Use   Vaping Use: Never used  Substance and Sexual Activity   Alcohol use: No   Drug use: No   Sexual activity: Yes  Other Topics Concern   Not on file  Social History Narrative   2 sons - one lives with them   Social Determinants of Health   Financial Resource Strain: Low Risk    Difficulty of Paying Living Expenses: Not hard at all  Food Insecurity: No Food Insecurity   Worried About Charity fundraiser in the Last Year: Never  true   Arboriculturist in the Last Year: Never true  Transportation Needs: No Transportation Needs   Lack of Transportation (Medical): No   Lack of Transportation (Non-Medical): No  Physical Activity: Sufficiently Active   Days of Exercise per Week: 7 days   Minutes of Exercise per Session: 30 min  Stress: No Stress Concern Present   Feeling of Stress : Not at all  Social Connections: Socially Integrated   Frequency of Communication with Friends and Family: Craig than three times a week   Frequency of Social Gatherings with Friends and Family: Craig than three times a week   Attends Religious Services: Craig than 4 times per year   Active Member of Taylor Craig or Organizations: Yes   Attends Music therapist: More than 4 times per year   Marital Status: Married  Human resources officer Violence: Not At Risk   Fear of Current or Ex-Partner: No   Emotionally Abused: No   Physically Abused: No   Sexually Abused: No   Past Surgical History:  Procedure Laterality Date   ABDOMINAL HYSTERECTOMY     BREAST EXCISIONAL BIOPSY Left    SKIN CANCER EXCISION     Family History  Problem Relation Age of Onset   Breast cancer Neg Hx     Current Outpatient Medications:    cyanocobalamin 1000 MCG tablet, Take 1,000 mcg by mouth daily., Disp: , Rfl:    Multiple Vitamin (MULTIVITAMIN WITH MINERALS) TABS tablet, Take 1 tablet by mouth daily., Disp: , Rfl:  atorvastatin (LIPITOR) 80 MG tablet, TAKE 1 TABLET BY MOUTH EVERY DAY IN THE EVENING, Disp: 90 tablet, Rfl: 3   rOPINIRole (REQUIP) 0.5 MG tablet, Take 1 tablet (0.5 mg total) by mouth at bedtime., Disp: 90 tablet, Rfl: 3  No Known Allergies   ROS: Review of Systems A comprehensive review of systems was negative.    Physical exam BP 120/69   Pulse 86   Temp (!) 97.1 F (36.2 C)   Resp 20   Ht 5' 1" (1.549 m)   Wt 157 lb (71.2 kg)   SpO2 98%   BMI 29.66 kg/m  General appearance: alert, cooperative, appears stated age, and no  distress Head: Normocephalic, without obvious abnormality, atraumatic Eyes: negative findings: lids and lashes normal, conjunctivae and sclerae normal, corneas clear, pupils equal, round, reactive to light and accomodation, and ears glasses Ears: normal TM's and external ear canals both ears Nose: Nares normal. Septum midline. Mucosa normal. No drainage or sinus tenderness. Throat: lips, mucosa, and tongue normal; teeth and gums normal Neck: no adenopathy, no carotid bruit, supple, symmetrical, trachea midline, and thyroid not enlarged, symmetric, no tenderness/mass/nodules Back: symmetric, no curvature. ROM normal. No CVA tenderness. Lungs: clear to auscultation bilaterally Heart: regular rate and rhythm, S1, S2 normal, no murmur, click, rub or gallop Abdomen: soft, non-tender; bowel sounds normal; no masses,  no organomegaly Extremities: extremities normal, atraumatic, no cyanosis or edema Pulses: 2+ and symmetric Skin: Skin color, texture, turgor normal. No rashes or lesions Lymph nodes: Cervical, supraclavicular, and axillary nodes normal. Neurologic: Alert and oriented X 3, normal strength and tone. Normal symmetric reflexes. Normal coordination and gait Psych: Mood stable, speech normal  Depression screen [facility] 2/9 09/25/2020 08/15/2020 09/20/2019  Decreased Interest 0 0 0  Down, Depressed, Hopeless 0 0 0  PHQ - 2 Score 0 0 0  Altered sleeping - - -  Tired, decreased energy - - -  Change in appetite - - -  Feeling bad or failure about yourself  - - -  Trouble concentrating - - -  Moving slowly or fidgety/restless - - -  Suicidal thoughts - - -  PHQ-9 Score - - -    Assessment/ Plan: Taylor Craig here for annual physical exam.   Annual physical exam  Restless legs syndrome (RLS) - Plan: TSH, Magnesium, rOPINIRole (REQUIP) 0.5 MG tablet  Pure hypertriglyceridemia - Plan: CMP14+EGFR, Lipid panel, TSH, atorvastatin (LIPITOR) 80 MG tablet  Lichen sclerosus of female genitalia  - Plan: CBC  History of melanoma - Plan: CBC  We will plan for DEXA scan at a future date.  This was not available in our office today.  Pneumococcal vaccination administered.  Holding off on Shingrix vaccination due to cost.  Restless leg symptoms are stable.  She will come in for fasting labs at a later date.  Requip renewed  Continue statin.  Plan for fasting lipid panel  CBC ordered but patient has been off of the clobetasol  No skin concerns today  Counseled on healthy lifestyle choices, including diet (rich in fruits, vegetables and lean meats and low in salt and simple carbohydrates) and exercise (at least 30 minutes of moderate physical activity daily).  Patient to follow up in 1 year for annual exam or sooner if needed.  Ashly M. Lajuana Ripple, DO

## 2020-09-25 NOTE — Patient Instructions (Addendum)
Come for fasting labs

## 2020-10-01 ENCOUNTER — Other Ambulatory Visit: Payer: Self-pay

## 2020-10-01 ENCOUNTER — Other Ambulatory Visit: Payer: Medicare Other

## 2020-10-01 DIAGNOSIS — Z85820 Personal history of malignant melanoma of skin: Secondary | ICD-10-CM

## 2020-10-01 DIAGNOSIS — G2581 Restless legs syndrome: Secondary | ICD-10-CM | POA: Diagnosis not present

## 2020-10-01 DIAGNOSIS — E781 Pure hyperglyceridemia: Secondary | ICD-10-CM

## 2020-10-01 DIAGNOSIS — N904 Leukoplakia of vulva: Secondary | ICD-10-CM

## 2020-10-01 NOTE — Procedures (Signed)
Cervical Epidural Steroid Injection - Interlaminar Approach with Fluoroscopic Guidance  Patient: Taylor Craig      Date of Birth: [birth year] MRN: [id number] PCP: Janora Norlander, DO      Visit Date: 09/11/2020   Universal Protocol:    Date/Time: 09/06/225:03 AM  Consent Given By: the patient  Position: PRONE  Additional Comments: Vital signs were monitored before and after the procedure. Patient was prepped and draped in the usual sterile fashion. The correct patient, procedure, and site was verified.   Injection Procedure Details:   Procedure diagnoses: Cervical radiculopathy [M54.12]    Meds Administered:  Meds ordered this encounter  Medications   betamethasone acetate-betamethasone sodium phosphate (CELESTONE) injection 12 mg     Laterality: Left  Location/Site: C7-T1  Needle: 3.5 in., 20 ga. Tuohy  Needle Placement: Paramedian epidural space  Findings:  -Comments: Excellent flow of contrast into the epidural space.  Procedure Details: Using a paramedian approach from the side mentioned above, the region overlying the inferior lamina was localized under fluoroscopic visualization and the soft tissues overlying this structure were infiltrated with 4 ml. of 1% Lidocaine without Epinephrine. A # 20 gauge, Tuohy needle was inserted into the epidural space using a paramedian approach.  The epidural space was localized using loss of resistance along with contralateral oblique bi-planar fluoroscopic views.  After negative aspirate for air, blood, and CSF, a 2 ml. volume of Isovue-250 was injected into the epidural space and the flow of contrast was observed. Radiographs were obtained for documentation purposes.   The injectate was administered into the level noted above.  Additional Comments:  The patient tolerated the procedure well Dressing: 2 x 2 sterile gauze and Band-Aid    Post-procedure details: Patient was observed during the procedure. Post-procedure  instructions were reviewed.  Patient left the clinic in stable condition.

## 2020-10-02 LAB — MAGNESIUM: Magnesium: 2 mg/dL (ref 1.6–2.3)

## 2020-10-02 LAB — CMP14+EGFR
ALT: 33 IU/L — ABNORMAL HIGH (ref 0–32)
AST: 30 IU/L (ref 0–40)
Albumin/Globulin Ratio: 2.2 (ref 1.2–2.2)
Albumin: 4.8 g/dL (ref 3.8–4.8)
Alkaline Phosphatase: 78 IU/L (ref 44–121)
BUN/Creatinine Ratio: 15 (ref 12–28)
BUN: 12 mg/dL (ref 8–27)
Bilirubin Total: 0.2 mg/dL (ref 0.0–1.2)
CO2: 21 mmol/L (ref 20–29)
Calcium: 9.7 mg/dL (ref 8.7–10.3)
Chloride: 104 mmol/L (ref 96–106)
Creatinine, Ser: 0.8 mg/dL (ref 0.57–1.00)
Globulin, Total: 2.2 g/dL (ref 1.5–4.5)
Glucose: 96 mg/dL (ref 65–99)
Potassium: 4.5 mmol/L (ref 3.5–5.2)
Sodium: 142 mmol/L (ref 134–144)
Total Protein: 7 g/dL (ref 6.0–8.5)
eGFR: 81 mL/min/{1.73_m2} (ref >59)

## 2020-10-02 LAB — CBC
Hematocrit: 44 % (ref 34.0–46.6)
Hemoglobin: 14.7 g/dL (ref 11.1–15.9)
MCH: 31.5 pg (ref 26.6–33.0)
MCHC: 33.4 g/dL (ref 31.5–35.7)
MCV: 94 fL (ref 79–97)
Platelets: 306 10*3/uL (ref 150–450)
RBC: 4.67 x10E6/uL (ref 3.77–5.28)
RDW: 12.4 % (ref 11.7–15.4)
WBC: 6 10*3/uL (ref 3.4–10.8)

## 2020-10-02 LAB — LIPID PANEL
Chol/HDL Ratio: 3.4 ratio (ref 0.0–4.4)
Cholesterol, Total: 191 mg/dL (ref 100–199)
HDL: 57 mg/dL (ref >39)
LDL Chol Calc (NIH): 90 mg/dL (ref 0–99)
Triglycerides: 264 mg/dL — ABNORMAL HIGH (ref 0–149)
VLDL Cholesterol Cal: 44 mg/dL — ABNORMAL HIGH (ref 5–40)

## 2020-10-02 LAB — TSH: TSH: 2.23 u[IU]/mL (ref 0.450–4.500)

## 2021-01-09 ENCOUNTER — Ambulatory Visit (INDEPENDENT_AMBULATORY_CARE_PROVIDER_SITE_OTHER): Payer: Medicare Other

## 2021-01-09 DIAGNOSIS — Z23 Encounter for immunization: Secondary | ICD-10-CM

## 2021-07-29 ENCOUNTER — Other Ambulatory Visit: Payer: Self-pay | Admitting: Family Medicine

## 2021-07-29 DIAGNOSIS — E781 Pure hyperglyceridemia: Secondary | ICD-10-CM

## 2021-07-30 ENCOUNTER — Telehealth: Payer: Self-pay | Admitting: Family Medicine

## 2021-07-30 ENCOUNTER — Other Ambulatory Visit: Payer: Self-pay | Admitting: Family Medicine

## 2021-07-30 DIAGNOSIS — Z0000 Encounter for general adult medical examination without abnormal findings: Secondary | ICD-10-CM

## 2021-07-30 DIAGNOSIS — Z1231 Encounter for screening mammogram for malignant neoplasm of breast: Secondary | ICD-10-CM

## 2021-07-30 NOTE — Telephone Encounter (Signed)
Patient aware.

## 2021-07-30 NOTE — Telephone Encounter (Signed)
Appt made 9-5 for annual physical

## 2021-07-30 NOTE — Telephone Encounter (Signed)
[facility] in Aug for annual PE. Mail order NOT sent

## 2021-07-30 NOTE — Telephone Encounter (Signed)
Lab orders placed.  

## 2021-08-14 ENCOUNTER — Other Ambulatory Visit: Payer: Self-pay | Admitting: Family Medicine

## 2021-08-14 DIAGNOSIS — G2581 Restless legs syndrome: Secondary | ICD-10-CM

## 2021-08-18 ENCOUNTER — Ambulatory Visit: Payer: Medicare Other

## 2021-09-18 ENCOUNTER — Ambulatory Visit: Payer: Medicare Other

## 2021-09-19 ENCOUNTER — Ambulatory Visit
Admission: RE | Admit: 2021-09-19 | Discharge: 2021-09-19 | Disposition: A | Discharge status: Home / Self Care | Payer: Medicare Other | Source: Ambulatory Visit | Attending: Family Medicine | Admitting: Family Medicine

## 2021-09-19 DIAGNOSIS — Z1231 Encounter for screening mammogram for malignant neoplasm of breast: Secondary | ICD-10-CM | POA: Diagnosis not present

## 2021-09-25 ENCOUNTER — Other Ambulatory Visit: Payer: Medicare Other

## 2021-09-25 DIAGNOSIS — Z0000 Encounter for general adult medical examination without abnormal findings: Secondary | ICD-10-CM

## 2021-09-25 DIAGNOSIS — Z136 Encounter for screening for cardiovascular disorders: Secondary | ICD-10-CM | POA: Diagnosis not present

## 2021-09-25 DIAGNOSIS — E785 Hyperlipidemia, unspecified: Secondary | ICD-10-CM | POA: Diagnosis not present

## 2021-09-26 LAB — CBC WITH DIFFERENTIAL/PLATELET
Basophils Absolute: 0 10*3/uL (ref 0.0–0.2)
Basos: 1 %
EOS (ABSOLUTE): 0.3 10*3/uL (ref 0.0–0.4)
Eos: 4 %
Hematocrit: 42.2 % (ref 34.0–46.6)
Hemoglobin: 14.1 g/dL (ref 11.1–15.9)
Immature Grans (Abs): 0 10*3/uL (ref 0.0–0.1)
Immature Granulocytes: 0 %
Lymphocytes Absolute: 2.6 10*3/uL (ref 0.7–3.1)
Lymphs: 39 %
MCH: 31.1 pg (ref 26.6–33.0)
MCHC: 33.4 g/dL (ref 31.5–35.7)
MCV: 93 fL (ref 79–97)
Monocytes Absolute: 0.4 10*3/uL (ref 0.1–0.9)
Monocytes: 6 %
Neutrophils Absolute: 3.3 10*3/uL (ref 1.4–7.0)
Neutrophils: 50 %
Platelets: 305 10*3/uL (ref 150–450)
RBC: 4.53 x10E6/uL (ref 3.77–5.28)
RDW: 12.2 % (ref 11.7–15.4)
WBC: 6.6 10*3/uL (ref 3.4–10.8)

## 2021-09-26 LAB — LIPID PANEL
Chol/HDL Ratio: 2.6 ratio (ref 0.0–4.4)
Cholesterol, Total: 157 mg/dL (ref 100–199)
HDL: 60 mg/dL (ref >39)
LDL Chol Calc (NIH): 71 mg/dL (ref 0–99)
Triglycerides: 152 mg/dL — ABNORMAL HIGH (ref 0–149)
VLDL Cholesterol Cal: 26 mg/dL (ref 5–40)

## 2021-09-26 LAB — COMPREHENSIVE METABOLIC PANEL
ALT: 35 IU/L — ABNORMAL HIGH (ref 0–32)
AST: 30 IU/L (ref 0–40)
Albumin/Globulin Ratio: 2.1 (ref 1.2–2.2)
Albumin: 4.6 g/dL (ref 3.9–4.9)
Alkaline Phosphatase: 79 IU/L (ref 44–121)
BUN/Creatinine Ratio: 12 (ref 12–28)
BUN: 10 mg/dL (ref 8–27)
Bilirubin Total: 0.3 mg/dL (ref 0.0–1.2)
CO2: 21 mmol/L (ref 20–29)
Calcium: 10.2 mg/dL (ref 8.7–10.3)
Chloride: 105 mmol/L (ref 96–106)
Creatinine, Ser: 0.81 mg/dL (ref 0.57–1.00)
Globulin, Total: 2.2 g/dL (ref 1.5–4.5)
Glucose: 93 mg/dL (ref 70–99)
Potassium: 4.5 mmol/L (ref 3.5–5.2)
Sodium: 141 mmol/L (ref 134–144)
Total Protein: 6.8 g/dL (ref 6.0–8.5)
eGFR: 80 mL/min/{1.73_m2} (ref >59)

## 2021-09-26 LAB — TSH: TSH: 2.14 u[IU]/mL (ref 0.450–4.500)

## 2021-09-30 ENCOUNTER — Ambulatory Visit (INDEPENDENT_AMBULATORY_CARE_PROVIDER_SITE_OTHER): Payer: Medicare Other | Admitting: Family Medicine

## 2021-09-30 ENCOUNTER — Encounter: Payer: Self-pay | Admitting: Family Medicine

## 2021-09-30 ENCOUNTER — Ambulatory Visit (INDEPENDENT_AMBULATORY_CARE_PROVIDER_SITE_OTHER): Payer: Medicare Other

## 2021-09-30 VITALS — BP 132/81 | HR 81 | Temp 97.4°F | Ht 61.0 in | Wt 164.4 lb

## 2021-09-30 DIAGNOSIS — Z780 Asymptomatic menopausal state: Secondary | ICD-10-CM | POA: Diagnosis not present

## 2021-09-30 DIAGNOSIS — Z1211 Encounter for screening for malignant neoplasm of colon: Secondary | ICD-10-CM

## 2021-09-30 DIAGNOSIS — Z0001 Encounter for general adult medical examination with abnormal findings: Secondary | ICD-10-CM | POA: Diagnosis not present

## 2021-09-30 DIAGNOSIS — M8589 Other specified disorders of bone density and structure, multiple sites: Secondary | ICD-10-CM | POA: Diagnosis not present

## 2021-09-30 DIAGNOSIS — E781 Pure hyperglyceridemia: Secondary | ICD-10-CM | POA: Diagnosis not present

## 2021-09-30 DIAGNOSIS — Z85820 Personal history of malignant melanoma of skin: Secondary | ICD-10-CM | POA: Diagnosis not present

## 2021-09-30 DIAGNOSIS — G2581 Restless legs syndrome: Secondary | ICD-10-CM

## 2021-09-30 DIAGNOSIS — Z0000 Encounter for general adult medical examination without abnormal findings: Secondary | ICD-10-CM

## 2021-09-30 DIAGNOSIS — M810 Age-related osteoporosis without current pathological fracture: Secondary | ICD-10-CM | POA: Diagnosis not present

## 2021-09-30 DIAGNOSIS — Z23 Encounter for immunization: Secondary | ICD-10-CM

## 2021-09-30 MED ORDER — ROPINIROLE HCL 0.5 MG PO TABS: 0.5000 mg | ORAL_TABLET | Freq: Every day | ORAL | 3 refills | Status: DC

## 2021-09-30 MED ORDER — ATORVASTATIN CALCIUM 80 MG PO TABS: ORAL_TABLET | ORAL | 3 refills | Status: DC

## 2021-09-30 NOTE — Patient Instructions (Signed)
Eustachian Tube Dysfunction  Eustachian tube dysfunction refers to a condition in which a blockage develops in the narrow passage that connects the middle ear to the back of the nose (eustachian tube). The eustachian tube regulates air pressure in the middle ear by letting air move between the ear and nose. It also helps to drain fluid from the middle ear space. Eustachian tube dysfunction can affect one or both ears. When the eustachian tube does not function properly, air pressure, fluid, or both can build up in the middle ear. What are the causes? This condition occurs when the eustachian tube becomes blocked or cannot open normally. Common causes of this condition include: Ear infections. Colds and other infections that affect the nose, mouth, and throat (upper respiratory tract). Allergies. Irritation from cigarette smoke. Irritation from stomach acid coming up into the esophagus (gastroesophageal reflux). The esophagus is the part of the body that moves food from the mouth to the stomach. Sudden changes in air pressure, such as from descending in an airplane or scuba diving. Abnormal growths in the nose or throat, such as: Growths that line the nose (nasal polyps). Abnormal growth of cells (tumors). Enlarged tissue at the back of the throat (adenoids). What increases the risk? You are more likely to develop this condition if: You smoke. You are overweight. You are a child who has: Certain birth defects of the mouth, such as cleft palate. Large tonsils or adenoids. What are the signs or symptoms? Common symptoms of this condition include: A feeling of fullness in the ear. Ear pain. Clicking or popping noises in the ear. Ringing in the ear (tinnitus). Hearing loss. Loss of balance. Dizziness. Symptoms may get worse when the air pressure around you changes, such as when you travel to an area of high elevation, fly on an airplane, or go scuba diving. How is this diagnosed? This  condition may be diagnosed based on: Your symptoms. A physical exam of your ears, nose, and throat. Tests, such as those that measure: The movement of your eardrum. Your hearing (audiometry). How is this treated? Treatment depends on the cause and severity of your condition. In mild cases, you may relieve your symptoms by moving air into your ears. This is called "popping the ears." In more severe cases, or if you have symptoms of fluid in your ears, treatment may include: Medicines to relieve congestion (decongestants). Medicines that treat allergies (antihistamines). Nasal sprays or ear drops that contain medicines that reduce swelling (steroids). A procedure to drain the fluid in your eardrum. In this procedure, a small tube may be placed in the eardrum to: Drain the fluid. Restore the air in the middle ear space. A procedure to insert a balloon device through the nose to inflate the opening of the eustachian tube (balloon dilation). Follow these instructions at home: Lifestyle Do not do any of the following until your health care provider approves: Travel to high altitudes. Fly in airplanes. Work in a pressurized cabin or room. Scuba dive. Do not use any products that contain nicotine or tobacco. These products include cigarettes, chewing tobacco, and vaping devices, such as e-cigarettes. If you need help quitting, ask your health care provider. Keep your ears dry. Wear fitted earplugs during showering and bathing. Dry your ears completely after. General instructions Take over-the-counter and prescription medicines only as told by your health care provider. Use techniques to help pop your ears as recommended by your health care provider. These may include: Chewing gum. Yawning. Frequent, forceful swallowing.   Closing your mouth, holding your nose closed, and gently blowing as if you are trying to blow air out of your nose. Keep all follow-up visits. This is important. Contact a  health care provider if: Your symptoms do not go away after treatment. Your symptoms come back after treatment. You are unable to pop your ears. You have: A fever. Pain in your ear. Pain in your head or neck. Fluid draining from your ear. Your hearing suddenly changes. You become very dizzy. You lose your balance. Get help right away if: You have a sudden, severe increase in any of your symptoms. Summary Eustachian tube dysfunction refers to a condition in which a blockage develops in the eustachian tube. It can be caused by ear infections, allergies, inhaled irritants, or abnormal growths in the nose or throat. Symptoms may include ear pain or fullness, hearing loss, or ringing in the ears. Mild cases are treated with techniques to unblock the ears, such as yawning or chewing gum. More severe cases are treated with medicines or procedures. This information is not intended to replace advice given to you by your health care provider. Make sure you discuss any questions you have with your health care provider. Document Revised: 03/25/2020 Document Reviewed: 03/25/2020 Elsevier Patient Education  2023 Elsevier Inc.  

## 2021-09-30 NOTE — Progress Notes (Signed)
Taylor Craig is a 66 y.o. female presents to office today for annual physical exam examination.    Concerns today include: Fatigue: present for a few months. No depression. Sleeping well.  Perhaps due to lack of schedule but tries to stay involved with her church, [employer] volunteering, etc. No blood loss reported  Occupation: retired  Diet: fair, Exercise: no structured reported Last eye exam: UTD Last dental exam: UTD Last colonoscopy: needs cologuard ordered Last mammogram: UTD Last pap smear: n/a Refills needed today: all Immunizations needed: Shingrix Immunization History  Administered Date(s) Administered   Fluad Quad(high Dose 65+) 01/11/2020, 01/09/2021   Influenza,inj,Quad PF,6+ Mos 11/16/2012, 11/27/2014, 11/25/2015, 10/22/2016, 10/28/2017, 09/30/2018   Influenza-Unspecified 11/16/2012   Moderna Sars-Covid-2 Vaccination 04/06/2019, 04/29/2019, 12/28/2019   Pneumococcal Conjugate-13 09/20/2019   Pneumococcal Polysaccharide-23 09/25/2020   Tdap 07/27/2016     Past Medical History:  Diagnosis Date   Cancer ([place]) 2004   melanoma    Hyperlipemia    Social History   Socioeconomic History   Marital status: Married    Spouse name: Holiday representative   Number of children: 2   Years of education: Not on file   Highest education level: Not on file  Occupational History   Occupation: retired  Tobacco Use   Smoking status: Never   Smokeless tobacco: Never  Vaping Use   Vaping Use: Never used  Substance and Sexual Activity   Alcohol use: No   Drug use: No   Sexual activity: Yes  Other Topics Concern   Not on file  Social History Narrative   2 sons - one lives with them   Social Determinants of Health   Financial Resource Strain: Low Risk  (08/15/2020)   Overall Financial Resource Strain (CARDIA)    Difficulty of Paying Living Expenses: Not hard at all  Food Insecurity: No Food Insecurity (08/15/2020)   Hunger Vital Sign    Worried About Running Out of Food in the Last  Year: Never true    [place] in the Last Year: Never true  Transportation Needs: No Transportation Needs (08/15/2020)   PRAPARE - Hydrologist (Medical): No    Lack of Transportation (Non-Medical): No  Physical Activity: Sufficiently Active (08/15/2020)   Exercise Vital Sign    Days of Exercise per Week: 7 days    Minutes of Exercise per Session: 30 min  Stress: No Stress Concern Present (08/15/2020)   [place]    Feeling of Stress : Not at all  Social Connections: [place] (08/15/2020)   Social Connection and Isolation Panel [NHANES]    Frequency of Communication with Friends and Family: More than three times a week    Frequency of Social Gatherings with Friends and Family: More than three times a week    Attends Religious Services: More than 4 times per year    Active Member of [employer] or Organizations: Yes    Attends Music therapist: More than 4 times per year    Marital Status: Married  Human resources officer Violence: Not At Risk (08/15/2020)   Humiliation, Afraid, Rape, and Kick questionnaire    Fear of Current or Ex-Partner: No    Emotionally Abused: No    Physically Abused: No    Sexually Abused: No   Past Surgical History:  Procedure Laterality Date   ABDOMINAL HYSTERECTOMY     BREAST EXCISIONAL BIOPSY Left    SKIN CANCER EXCISION  Family History  Problem Relation Age of Onset   Breast cancer Neg Hx     Current Outpatient Medications:    atorvastatin (LIPITOR) 80 MG tablet, TAKE 1 TABLET BY MOUTH EVERY DAY IN THE EVENING, Disp: 90 tablet, Rfl: 3   cyanocobalamin 1000 MCG tablet, Take 1,000 mcg by mouth daily., Disp: , Rfl:    Multiple Vitamin (MULTIVITAMIN WITH MINERALS) TABS tablet, Take 1 tablet by mouth daily., Disp: , Rfl:    rOPINIRole (REQUIP) 0.5 MG tablet, Take 1 tablet (0.5 mg total) by mouth at bedtime., Disp: 90 tablet, Rfl: 3  No Known  Allergies   ROS: Review of Systems A comprehensive review of systems was negative except for: Constitutional: positive for fatigue  and tinnitus  Physical exam BP 132/81   Pulse 81   Temp (!) 97.4 F (36.3 C)   Ht '5\' 1"'$  (1.549 m)   Wt 164 lb 6.4 oz (74.6 kg)   SpO2 96%   BMI 31.06 kg/m  General appearance: alert, cooperative, appears stated age, no distress, and moderately obese Head: Normocephalic, without obvious abnormality, atraumatic Eyes: negative findings: lids and lashes normal, conjunctivae and sclerae normal, corneas clear, and pupils equal, round, reactive to light and accomodation Ears: normal TM's and external ear canals both ears Nose: Nares normal. Septum midline. Mucosa normal. No drainage or sinus tenderness. Throat: lips, mucosa, and tongue normal; teeth and gums normal Neck: no adenopathy, no carotid bruit, supple, symmetrical, trachea midline, and thyroid not enlarged, symmetric, no tenderness/mass/nodules Back: symmetric, no curvature. ROM normal. No CVA tenderness. Lungs: clear to auscultation bilaterally Heart: regular rate and rhythm, S1, S2 normal, no murmur, click, rub or gallop Abdomen: soft, non-tender; bowel sounds normal; no masses,  no organomegaly Extremities: extremities normal, atraumatic, no cyanosis or edema Pulses: 2+ and symmetric Skin: Skin color, texture, turgor normal. No rashes or lesions Lymph nodes: Cervical, supraclavicular, and axillary nodes normal. Neurologic: Alert and oriented X 3, normal strength and tone. Normal symmetric reflexes. Normal coordination and gait Psych: mood stable, speech normal.  Flowsheet Row Office Visit from 09/30/2021 in [place]  PHQ-2 Total Score 0        Assessment/ Plan: [name] here for annual physical exam.   Annual physical exam  Restless legs syndrome (RLS) - Plan: rOPINIRole (REQUIP) 0.5 MG tablet  Pure hypertriglyceridemia - Plan: atorvastatin (LIPITOR) 80  MG tablet  History of melanoma  Screen for colon cancer - Plan: Cologuard  Asymptomatic postmenopausal estrogen deficiency - Plan: DG [facility] DEXA  We will get DEXA scan performed today.  Labs were stable.  Medications have been renewed.  Cologuard ordered for colon cancer screening.  No apparent contraindications to use of the stool for screening purposes  Lipid controlled. Continue current regimen  No skin lesions of concern today. Continue follow up with derm given h/o melanoma  DEXA ordered today.  Counseled on healthy lifestyle choices, including diet (rich in fruits, vegetables and lean meats and low in salt and simple carbohydrates) and exercise (at least 30 minutes of moderate physical activity daily).  Patient to follow up in 1 year for annual exam or sooner if needed.  Zehava Turski M. Lajuana Ripple, DO

## 2021-10-01 LAB — SPECIMEN STATUS REPORT

## 2021-10-13 DIAGNOSIS — Z1211 Encounter for screening for malignant neoplasm of colon: Secondary | ICD-10-CM | POA: Diagnosis not present

## 2021-10-18 LAB — COLOGUARD: COLOGUARD: NEGATIVE

## 2021-12-04 ENCOUNTER — Telehealth: Payer: Self-pay | Admitting: Family Medicine

## 2021-12-04 NOTE — Telephone Encounter (Signed)
Left message for patient to call back and schedule Medicare Annual Wellness Visit (AWV) to be completed by video or phone.   Last AWV: 08/15/2020   Please schedule at anytime with [place]     45 minute appointment  Any questions, please contact me at [phone]

## 2022-03-23 ENCOUNTER — Telehealth: Payer: Self-pay | Admitting: Family Medicine

## 2022-03-23 NOTE — Telephone Encounter (Signed)
Contacted [name] to schedule their annual wellness visit. Appointment made for 04/07/2022.  Thank you,  Colletta [place],  [place] [facility] Direct Dial ??[phone]

## 2022-04-07 ENCOUNTER — Ambulatory Visit (INDEPENDENT_AMBULATORY_CARE_PROVIDER_SITE_OTHER): Payer: Medicare Other

## 2022-04-07 VITALS — Ht 61.0 in | Wt 155.0 lb

## 2022-04-07 DIAGNOSIS — Z0000 Encounter for general adult medical examination without abnormal findings: Secondary | ICD-10-CM

## 2022-04-07 NOTE — Patient Instructions (Signed)
Taylor Craig , Thank you for taking time to come for your Medicare Wellness Visit. I appreciate your ongoing commitment to your health goals. Please review the following plan we discussed and let me know if I can assist you in the future.   These are the goals we discussed:  Goals      Exercise 3x per week (30 min per time)        This is a list of the screening recommended for you and due dates:  Health Maintenance  Topic Date Due   COVID-19 Vaccine (4 - 2023-24 season) 09/26/2021   Zoster (Shingles) Vaccine (2 of 2) 11/25/2021   Flu Shot  04/26/2022*   Mammogram  09/20/2022   Medicare Annual Wellness Visit  04/07/2023   Cologuard (Stool DNA test)  10/13/2024   DTaP/Tdap/Td vaccine (2 - Td or Tdap) 07/28/2026   Pneumonia Vaccine  Completed   DEXA scan (bone density measurement)  Completed   Hepatitis C Screening: USPSTF Recommendation to screen - Ages 30-79 yo.  Completed   HPV Vaccine  Aged Out  *Topic was postponed. The date shown is not the original due date.    Advanced directives: Advance directive discussed with you today. I have provided a copy for you to complete at home and have notarized. Once this is complete please bring a copy in to our office so we can scan it into your chart.  Conditions/risks identified: Aim for 30 minutes of exercise or brisk walking, 6-8 glasses of water, and 5 servings of fruits and vegetables each day.   Next appointment: Follow up in one year for your annual wellness visit    Preventive Care 65 Years and Older, Female Preventive care refers to lifestyle choices and visits with your health care provider that can promote health and wellness. What does preventive care include? A yearly physical exam. This is also called an annual well check. Dental exams once or twice a year. Routine eye exams. Ask your health care provider how often you should have your eyes checked. Personal lifestyle choices, including: Daily care of your teeth and  gums. Regular physical activity. Eating a healthy diet. Avoiding tobacco and drug use. Limiting alcohol use. Practicing safe sex. Taking low-dose aspirin every day. Taking vitamin and mineral supplements as recommended by your health care provider. What happens during an annual well check? The services and screenings done by your health care provider during your annual well check will depend on your age, overall health, lifestyle risk factors, and family history of disease. Counseling  Your health care provider may ask you questions about your: Alcohol use. Tobacco use. Drug use. Emotional well-being. Home and relationship well-being. Sexual activity. Eating habits. History of falls. Memory and ability to understand (cognition). Work and work Statistician. Reproductive health. Screening  You may have the following tests or measurements: Height, weight, and BMI. Blood pressure. Lipid and cholesterol levels. These may be checked every 5 years, or more frequently if you are over 35 years old. Skin check. Lung cancer screening. You may have this screening every year starting at age 65 if you have a 30-pack-year history of smoking and currently smoke or have quit within the past 15 years. Fecal occult blood test (FOBT) of the stool. You may have this test every year starting at age 71. Flexible sigmoidoscopy or colonoscopy. You may have a sigmoidoscopy every 5 years or a colonoscopy every 10 years starting at age 76. Hepatitis C blood test. Hepatitis B blood test. Sexually  transmitted disease (STD) testing. Diabetes screening. This is done by checking your blood sugar (glucose) after you have not eaten for a while (fasting). You may have this done every 1-3 years. Bone density scan. This is done to screen for osteoporosis. You may have this done starting at age 13. Mammogram. This may be done every 1-2 years. Talk to your health care provider about how often you should have regular  mammograms. Talk with your health care provider about your test results, treatment options, and if necessary, the need for more tests. Vaccines  Your health care provider may recommend certain vaccines, such as: Influenza vaccine. This is recommended every year. Tetanus, diphtheria, and acellular pertussis (Tdap, Td) vaccine. You may need a Td booster every 10 years. Zoster vaccine. You may need this after age 32. Pneumococcal 13-valent conjugate (PCV13) vaccine. One dose is recommended after age 22. Pneumococcal polysaccharide (PPSV23) vaccine. One dose is recommended after age 84. Talk to your health care provider about which screenings and vaccines you need and how often you need them. This information is not intended to replace advice given to you by your health care provider. Make sure you discuss any questions you have with your health care provider. Document Released: 02/08/2015 Document Revised: 10/02/2015 Document Reviewed: 11/13/2014 Elsevier Interactive Patient Education  2017 [place] Prevention in the Home Falls can cause injuries. They can happen to people of all ages. There are many things you can do to make your home safe and to help prevent falls. What can I do on the outside of my home? Regularly fix the edges of walkways and driveways and fix any cracks. Remove anything that might make you trip as you walk through a door, such as a raised step or threshold. Trim any bushes or trees on the path to your home. Use bright outdoor lighting. Clear any walking paths of anything that might make someone trip, such as rocks or tools. Regularly check to see if handrails are loose or broken. Make sure that both sides of any steps have handrails. Any raised decks and porches should have guardrails on the edges. Have any leaves, snow, or ice cleared regularly. Use sand or salt on walking paths during winter. Clean up any spills in your garage right away. This includes oil  or grease spills. What can I do in the bathroom? Use night lights. Install grab bars by the toilet and in the tub and shower. Do not use towel bars as grab bars. Use non-skid mats or decals in the tub or shower. If you need to sit down in the shower, use a plastic, non-slip stool. Keep the floor dry. Clean up any water that spills on the floor as soon as it happens. Remove soap buildup in the tub or shower regularly. Attach bath mats securely with double-sided non-slip rug tape. Do not have throw rugs and other things on the floor that can make you trip. What can I do in the bedroom? Use night lights. Make sure that you have a light by your bed that is easy to reach. Do not use any sheets or blankets that are too big for your bed. They should not hang down onto the floor. Have a firm chair that has side arms. You can use this for support while you get dressed. Do not have throw rugs and other things on the floor that can make you trip. What can I do in the kitchen? Clean up any spills right away. Avoid  walking on wet floors. Keep items that you use a lot in easy-to-reach places. If you need to reach something above you, use a strong step stool that has a grab bar. Keep electrical cords out of the way. Do not use floor polish or wax that makes floors slippery. If you must use wax, use non-skid floor wax. Do not have throw rugs and other things on the floor that can make you trip. What can I do with my stairs? Do not leave any items on the stairs. Make sure that there are handrails on both sides of the stairs and use them. Fix handrails that are broken or loose. Make sure that handrails are as long as the stairways. Check any carpeting to make sure that it is firmly attached to the stairs. Fix any carpet that is loose or worn. Avoid having throw rugs at the top or bottom of the stairs. If you do have throw rugs, attach them to the floor with carpet tape. Make sure that you have a light  switch at the top of the stairs and the bottom of the stairs. If you do not have them, ask someone to add them for you. What else can I do to help prevent falls? Wear shoes that: Do not have high heels. Have rubber bottoms. Are comfortable and fit you well. Are closed at the toe. Do not wear sandals. If you use a stepladder: Make sure that it is fully opened. Do not climb a closed stepladder. Make sure that both sides of the stepladder are locked into place. Ask someone to hold it for you, if possible. Clearly mark and make sure that you can see: Any grab bars or handrails. First and last steps. Where the edge of each step is. Use tools that help you move around (mobility aids) if they are needed. These include: Canes. Walkers. Scooters. Crutches. Turn on the lights when you go into a dark area. Replace any light bulbs as soon as they burn out. Set up your furniture so you have a clear path. Avoid moving your furniture around. If any of your floors are uneven, fix them. If there are any pets around you, be aware of where they are. Review your medicines with your doctor. Some medicines can make you feel dizzy. This can increase your chance of falling. Ask your doctor what other things that you can do to help prevent falls. This information is not intended to replace advice given to you by your health care provider. Make sure you discuss any questions you have with your health care provider. Document Released: 11/08/2008 Document Revised: 06/20/2015 Document Reviewed: 02/16/2014 Elsevier Interactive Patient Education  2017 [employer].

## 2022-04-07 NOTE — Progress Notes (Signed)
Subjective:   Taylor Craig is a 67 y.o. female who presents for Medicare Annual (Subsequent) preventive examination. I connected with  [name] on 04/07/22 by a audio enabled telemedicine application and verified that I am speaking with the correct person using two identifiers.  Patient Location: Home  Provider Location: Home Office  I discussed the limitations of evaluation and management by telemedicine. The patient expressed understanding and agreed to proceed.  Review of Systems     Cardiac Risk Factors include: advanced age (>33mn, >>20women);dyslipidemia     Objective:    Today's Vitals   04/07/22 1351  Weight: 155 lb (70.3 kg)  Height: '5\' 1"'$  (1.549 m)   Body mass index is 29.29 kg/m.     04/07/2022    1:53 PM 08/15/2020    4:26 PM 08/23/2014    5:45 PM 07/16/2014   10:33 AM  Advanced Directives  Does Patient Have a Medical Advance Directive? No No No No  Would patient like information on creating a medical advance directive? No - Patient declined No - Patient declined  No - patient declined information    Current Medications (verified) Outpatient Encounter Medications as of 04/07/2022  Medication Sig   atorvastatin (LIPITOR) 80 MG tablet TAKE 1 TABLET BY MOUTH EVERY DAY IN THE EVENING   cyanocobalamin 1000 MCG tablet Take 1,000 mcg by mouth daily.   Multiple Vitamin (MULTIVITAMIN WITH MINERALS) TABS tablet Take 1 tablet by mouth daily.   rOPINIRole (REQUIP) 0.5 MG tablet Take 1 tablet (0.5 mg total) by mouth at bedtime.   No facility-administered encounter medications on file as of 04/07/2022.    Allergies (verified) Patient has no known allergies.   History: Past Medical History:  Diagnosis Date   [place]   melanoma    Hyperlipemia    Past Surgical History:  Procedure Laterality Date   ABDOMINAL HYSTERECTOMY     BREAST EXCISIONAL BIOPSY Left    SKIN CANCER EXCISION     Family History  Problem Relation Age of Onset   Breast  cancer Neg Hx    Social History   Socioeconomic History   Marital status: Married    Spouse name: BHoliday representative  Number of children: 2   Years of education: Not on file   Highest education level: Not on file  Occupational History   Occupation: retired  Tobacco Use   Smoking status: Never   Smokeless tobacco: Never  Vaping Use   Vaping Use: Never used  Substance and Sexual Activity   Alcohol use: No   Drug use: No   Sexual activity: Yes  Other Topics Concern   Not on file  Social History Narrative   2 sons - one lives with them   Social Determinants of Health   Financial Resource Strain: Low Risk  (04/07/2022)   Overall Financial Resource Strain (CARDIA)    Difficulty of Paying Living Expenses: Not hard at all  Food Insecurity: No Food Insecurity (04/07/2022)   Hunger Vital Sign    Worried About Running Out of Food in the Last Year: Never true    Ran Out of Food in the Last Year: Never true  Transportation Needs: No Transportation Needs (08/15/2020)   PRAPARE - THydrologist(Medical): No    Lack of Transportation (Non-Medical): No  Physical Activity: Insufficiently Active (04/07/2022)   Exercise Vital Sign    Days of Exercise per Week: 3 days    Minutes of  Exercise per Session: 30 min  Stress: No Stress Concern Present (04/07/2022)   Halfway    Feeling of Stress : Not at all  Social Connections: Moderately Integrated (04/07/2022)   Social Connection and Isolation Panel [NHANES]    Frequency of Communication with Friends and Family: More than three times a week    Frequency of Social Gatherings with Friends and Family: More than three times a week    Attends Religious Services: More than 4 times per year    Active Member of [employer] or Organizations: No    Attends Music therapist: Never    Marital Status: Married    Tobacco Counseling Counseling given: Not  Answered   Clinical Intake:  Pre-visit preparation completed: Yes  Pain : No/denies pain     Nutritional Risks: None Diabetes: No  How often do you need to have someone help you when you read instructions, pamphlets, or other written materials from your doctor or pharmacy?: 1 - Never  Diabetic?no   Interpreter Needed?: No  Information entered by :: Jadene Pierini, LPN   Activities of Daily Living    04/07/2022    1:53 PM  In your present state of health, do you have any difficulty performing the following activities:  Hearing? 0  Vision? 0  Difficulty concentrating or making decisions? 0  Walking or climbing stairs? 0  Dressing or bathing? 0  Doing errands, shopping? 0  Preparing Food and eating ? N  Using the Toilet? N  In the past six months, have you accidently leaked urine? N  Do you have problems with loss of bowel control? N  Managing your Medications? N  Managing your Finances? N  Housekeeping or managing your Housekeeping? N    Patient Care Team: Janora Norlander, DO as PCP - General (Family Medicine)  Indicate any recent Medical Services you may have received from other than [facility] providers in the past year (date may be approximate).     Assessment:   This is a routine wellness examination for [facility].  Hearing/Vision screen Vision Screening - Comments:: Wears rx glasses - up to date with routine eye exams with  Dr.Johnson   Dietary issues and exercise activities discussed: Current Exercise Habits: Home exercise routine, Type of exercise: walking, Time (Minutes): 30, Frequency (Times/Week): 3, Weekly Exercise (Minutes/Week): 90, Intensity: Mild, Exercise limited by: None identified   Goals Addressed             This Visit's Progress    Exercise 3x per week (30 min per time)   On track      Depression Screen    04/07/2022    1:53 PM 09/30/2021    3:42 PM 09/25/2020    1:13 PM 08/15/2020    4:25 PM 09/20/2019    1:07 PM 09/30/2018    1:23 PM  09/13/2018    3:31 PM  PHQ 2/9 Scores  PHQ - 2 Score 0 0 0 0 0 0 0  PHQ- 9 Score  2    0     Fall Risk    04/07/2022    1:51 PM 09/30/2021    3:42 PM 09/25/2020    1:13 PM 08/15/2020    4:26 PM 09/20/2019    1:07 PM  [place] in the past year? 0 0 0 0 0  Number falls in past yr: 0   0   Injury with Fall? 0  0   Risk for fall due to : No Fall Risks   No Fall Risks   Follow up Falls prevention discussed   Falls prevention discussed     FALL RISK PREVENTION PERTAINING TO THE HOME:  Any stairs in or around the home? Yes  If so, are there any without handrails? No  Home free of loose throw rugs in walkways, pet beds, electrical cords, etc? Yes  Adequate lighting in your home to reduce risk of falls? Yes   ASSISTIVE DEVICES UTILIZED TO PREVENT FALLS:  Life alert? No  Use of a cane, walker or w/c? No  Grab bars in the bathroom? Yes  Shower chair or bench in shower? No  Elevated toilet seat or a handicapped toilet? No   :        04/07/2022    1:54 PM 08/15/2020    4:27 PM  6CIT Screen  What Year? 0 points 0 points  What month? 0 points 0 points  What time? 0 points 0 points  Count back from 20 0 points 0 points  Months in reverse 0 points 0 points  Repeat phrase 0 points 0 points  Total Score 0 points 0 points    Immunizations Immunization History  Administered Date(s) Administered   Fluad Quad(high Dose 65+) 01/11/2020, 01/09/2021   Influenza,inj,Quad PF,6+ Mos 11/16/2012, 11/27/2014, 11/25/2015, 10/22/2016, 10/28/2017, 09/30/2018   Influenza-Unspecified 11/16/2012   Moderna Sars-Covid-2 Vaccination 04/06/2019, 04/29/2019, 12/28/2019   Pneumococcal Conjugate-13 09/20/2019   Pneumococcal Polysaccharide-23 09/25/2020   Tdap 07/27/2016   Zoster Recombinat (Shingrix) 09/30/2021    TDAP status: Up to date  Flu Vaccine status: Up to date  Pneumococcal vaccine status: Up to date  Covid-19 vaccine status: Completed vaccines  Qualifies for Shingles Vaccine?  Yes   Zostavax completed Yes   Shingrix Completed?: Yes  Screening Tests Health Maintenance  Topic Date Due   COVID-19 Vaccine (4 - 2023-24 season) 09/26/2021   Zoster Vaccines- Shingrix (2 of 2) 11/25/2021   INFLUENZA VACCINE  04/26/2022 (Originally 08/26/2021)   MAMMOGRAM  09/20/2022   Medicare Annual Wellness (AWV)  04/07/2023   Fecal DNA (Cologuard)  10/13/2024   DTaP/Tdap/Td (2 - Td or Tdap) 07/28/2026   Pneumonia Vaccine 4+ Years old  Completed   DEXA SCAN  Completed   Hepatitis C Screening  Completed   HPV VACCINES  Aged Out    Health Maintenance  Health Maintenance Due  Topic Date Due   COVID-19 Vaccine (4 - 2023-24 season) 09/26/2021   Zoster Vaccines- Shingrix (2 of 2) 11/25/2021    Colorectal cancer screening: Type of screening: Cologuard. Completed 10/13/2021. Repeat every 3 years  Mammogram status: Completed 09/19/2021. Repeat every year  Bone Density status: Completed 09/30/2021. Results reflect: Bone density results: OSTEOPENIA. Repeat every 5 years.  Lung Cancer Screening: (Low Dose CT Chest recommended if Age 77-80 years, 30 pack-year currently smoking OR have quit w/in 15years.) does not qualify.   Lung Cancer Screening Referral: n/a  Additional Screening:  Hepatitis C Screening: does not qualify; Completed 07/27/2016  Vision Screening: Recommended annual ophthalmology exams for early detection of glaucoma and other disorders of the eye. Is the patient up to date with their annual eye exam?  Yes  Who is the provider or what is the name of the office in which the patient attends annual eye exams? Dr.Johnson  If pt is not established with a provider, would they like to be referred to a provider to establish care? No .   Dental  Screening: Recommended annual dental exams for proper oral hygiene  Community Resource Referral / Chronic Care Management: CRR required this visit?  No   CCM required this visit?  No      Plan:     I have personally  reviewed and noted the following in the patient's chart:   Medical and social history Use of alcohol, tobacco or illicit drugs  Current medications and supplements including opioid prescriptions. Patient is not currently taking opioid prescriptions. Functional ability and status Nutritional status Physical activity Advanced directives List of other physicians Hospitalizations, surgeries, and ER visits in previous 12 months Vitals Screenings to include cognitive, depression, and falls Referrals and appointments  In addition, I have reviewed and discussed with patient certain preventive protocols, quality metrics, and best practice recommendations. A written personalized care plan for preventive services as well as general preventive health recommendations were provided to patient.     Daphane Shepherd, LPN   [id number]   Nurse Notes: Due 2nd dose Shingrix

## 2022-05-23 ENCOUNTER — Other Ambulatory Visit: Payer: Self-pay | Admitting: Family Medicine

## 2022-05-23 DIAGNOSIS — G2581 Restless legs syndrome: Secondary | ICD-10-CM

## 2022-05-23 DIAGNOSIS — E781 Pure hyperglyceridemia: Secondary | ICD-10-CM

## 2022-08-07 ENCOUNTER — Other Ambulatory Visit: Payer: Self-pay | Admitting: Family Medicine

## 2022-08-07 DIAGNOSIS — E781 Pure hyperglyceridemia: Secondary | ICD-10-CM

## 2022-08-07 DIAGNOSIS — G2581 Restless legs syndrome: Secondary | ICD-10-CM

## 2022-08-17 ENCOUNTER — Encounter: Payer: Self-pay | Admitting: Family Medicine

## 2022-08-17 ENCOUNTER — Other Ambulatory Visit: Payer: Self-pay | Admitting: Family Medicine

## 2022-08-17 DIAGNOSIS — Z1231 Encounter for screening mammogram for malignant neoplasm of breast: Secondary | ICD-10-CM

## 2022-08-17 DIAGNOSIS — Z0000 Encounter for general adult medical examination without abnormal findings: Secondary | ICD-10-CM

## 2022-09-23 ENCOUNTER — Ambulatory Visit
Admission: RE | Admit: 2022-09-23 | Discharge: 2022-09-23 | Disposition: A | Discharge status: Home / Self Care | Payer: Medicare Other | Source: Ambulatory Visit | Attending: Family Medicine | Admitting: Family Medicine

## 2022-09-23 DIAGNOSIS — Z0000 Encounter for general adult medical examination without abnormal findings: Secondary | ICD-10-CM

## 2022-09-23 DIAGNOSIS — Z1231 Encounter for screening mammogram for malignant neoplasm of breast: Secondary | ICD-10-CM | POA: Diagnosis not present

## 2022-10-11 ENCOUNTER — Other Ambulatory Visit: Payer: Self-pay | Admitting: Family Medicine

## 2022-10-11 DIAGNOSIS — G2581 Restless legs syndrome: Secondary | ICD-10-CM

## 2022-10-26 ENCOUNTER — Other Ambulatory Visit: Payer: Self-pay | Admitting: Family Medicine

## 2022-10-26 DIAGNOSIS — E781 Pure hyperglyceridemia: Secondary | ICD-10-CM

## 2022-11-18 ENCOUNTER — Other Ambulatory Visit: Payer: Self-pay

## 2022-11-18 ENCOUNTER — Ambulatory Visit: Payer: Medicare Other | Admitting: Family Medicine

## 2022-11-18 ENCOUNTER — Encounter: Payer: Self-pay | Admitting: Family Medicine

## 2022-11-18 VITALS — BP 129/83 | HR 91 | Temp 98.4°F | Ht 61.0 in | Wt 167.0 lb

## 2022-11-18 DIAGNOSIS — Z0000 Encounter for general adult medical examination without abnormal findings: Secondary | ICD-10-CM

## 2022-11-18 DIAGNOSIS — Z23 Encounter for immunization: Secondary | ICD-10-CM | POA: Diagnosis not present

## 2022-11-18 DIAGNOSIS — Z85820 Personal history of malignant melanoma of skin: Secondary | ICD-10-CM

## 2022-11-18 DIAGNOSIS — E781 Pure hyperglyceridemia: Secondary | ICD-10-CM

## 2022-11-18 DIAGNOSIS — G2581 Restless legs syndrome: Secondary | ICD-10-CM

## 2022-11-18 DIAGNOSIS — M810 Age-related osteoporosis without current pathological fracture: Secondary | ICD-10-CM

## 2022-11-18 DIAGNOSIS — R5383 Other fatigue: Secondary | ICD-10-CM | POA: Diagnosis not present

## 2022-11-18 DIAGNOSIS — Z0001 Encounter for general adult medical examination with abnormal findings: Secondary | ICD-10-CM | POA: Diagnosis not present

## 2022-11-18 MED ORDER — RISEDRONATE SODIUM 150 MG PO TABS: 150.0000 mg | ORAL_TABLET | ORAL | 4 refills | Status: DC

## 2022-11-18 MED ORDER — ROPINIROLE HCL 0.5 MG PO TABS: 0.5000 mg | ORAL_TABLET | Freq: Every day | ORAL | 3 refills | Status: DC

## 2022-11-18 MED ORDER — ATORVASTATIN CALCIUM 80 MG PO TABS: ORAL_TABLET | ORAL | 3 refills | Status: DC

## 2022-11-18 NOTE — Progress Notes (Signed)
Taylor Craig is a 67 y.o. female presents to office today for annual physical exam examination.    Concerns today include: 1. none  Occupation: retired, Marital status: married about to celebrate her 51st wedding anniversary. Going to [employer], Substance use: none Health Maintenance Due  Topic Date Due   Zoster Vaccines- Shingrix (2 of 2) 11/25/2021   INFLUENZA VACCINE  08/27/2022   Refills needed today: all  Immunization History  Administered Date(s) Administered   Fluad Quad(high Dose 65+) 01/11/2020, 01/09/2021   Influenza,inj,Quad PF,6+ Mos 11/16/2012, 11/27/2014, 11/25/2015, 10/22/2016, 10/28/2017, 09/30/2018   Influenza-Unspecified 11/16/2012   Moderna Sars-Covid-2 Vaccination 04/06/2019, 04/29/2019, 12/28/2019   Pneumococcal Conjugate-13 09/20/2019   Pneumococcal Polysaccharide-23 09/25/2020   Tdap 07/27/2016   Zoster Recombinant(Shingrix) 09/30/2021   Past Medical History:  Diagnosis Date   Cancer (HCC) 2004   melanoma    Hyperlipemia    Social History   Socioeconomic History   Marital status: Married    Spouse name: Therapist, nutritional   Number of children: 2   Years of education: Not on file   Highest education level: Not on file  Occupational History   Occupation: retired  Tobacco Use   Smoking status: Never   Smokeless tobacco: Never  Vaping Use   Vaping status: Never Used  Substance and Sexual Activity   Alcohol use: No   Drug use: No   Sexual activity: Yes  Other Topics Concern   Not on file  Social History Narrative   2 sons - one lives with them   Social Determinants of Health   Financial Resource Strain: Low Risk  (04/07/2022)   Overall Financial Resource Strain (CARDIA)    Difficulty of Paying Living Expenses: Not hard at all  Food Insecurity: No Food Insecurity (04/07/2022)   Hunger Vital Sign    Worried About Running Out of Food in the Last Year: Never true    Ran Out of Food in the Last Year: Never true  Transportation Needs: No  Transportation Needs (08/15/2020)   PRAPARE - Administrator, Civil Service (Medical): No    Lack of Transportation (Non-Medical): No  Physical Activity: Insufficiently Active (04/07/2022)   Exercise Vital Sign    Days of Exercise per Week: 3 days    Minutes of Exercise per Session: 30 min  Stress: No Stress Concern Present (04/07/2022)   [employer] of Occupational Health - Occupational Stress Questionnaire    Feeling of Stress : Not at all  Social Connections: Moderately Integrated (04/07/2022)   Social Connection and Isolation Panel [NHANES]    Frequency of Communication with Friends and Family: More than three times a week    Frequency of Social Gatherings with Friends and Family: More than three times a week    Attends Religious Services: More than 4 times per year    Active Member of [employer] or Organizations: No    Attends Banker Meetings: Never    Marital Status: Married  Catering manager Violence: Not At Risk (08/15/2020)   Humiliation, Afraid, Rape, and Kick questionnaire    Fear of Current or Ex-Partner: No    Emotionally Abused: No    Physically Abused: No    Sexually Abused: No   Past Surgical History:  Procedure Laterality Date   ABDOMINAL HYSTERECTOMY     BREAST EXCISIONAL BIOPSY Left    SKIN CANCER EXCISION     Family History  Problem Relation Age of Onset   Breast cancer Neg Hx  Current Outpatient Medications:    atorvastatin (LIPITOR) 80 MG tablet, TAKE 1 TABLET BY MOUTH DAILY IN  THE EVENING, Disp: 90 tablet, Rfl: 0   cyanocobalamin 1000 MCG tablet, Take 1,000 mcg by mouth daily., Disp: , Rfl:    Multiple Vitamin (MULTIVITAMIN WITH MINERALS) TABS tablet, Take 1 tablet by mouth daily., Disp: , Rfl:    rOPINIRole (REQUIP) 0.5 MG tablet, TAKE 1 TABLET BY MOUTH AT  BEDTIME, Disp: 90 tablet, Rfl: 0  No Known Allergies   ROS: Review of Systems A comprehensive review of systems was negative except for: Constitutional: positive for  decreased energy. Not exercising    Physical exam BP 129/83   Pulse 91   Temp 98.4 F (36.9 C)   Ht 5\' 1"  (1.549 m)   Wt 167 lb (75.8 kg)   SpO2 97%   BMI 31.55 kg/m  General appearance: alert, cooperative, appears stated age, no distress, and mildly obese Head: Normocephalic, without obvious abnormality, atraumatic Eyes: negative findings: lids and lashes normal, conjunctivae and sclerae normal, corneas clear, and pupils equal, round, reactive to light and accomodation Ears: normal TM's and external ear canals both ears Nose: Nares normal. Septum midline. Mucosa normal. No drainage or sinus tenderness. Throat: lips, mucosa, and tongue normal; teeth and gums normal Neck: no adenopathy, no carotid bruit, supple, symmetrical, trachea midline, and thyroid not enlarged, symmetric, no tenderness/mass/nodules Back: symmetric, no curvature. ROM normal. No CVA tenderness. Lungs: clear to auscultation bilaterally Heart: regular rate and rhythm, S1, S2 normal, no murmur, click, rub or gallop Abdomen: soft, non-tender; bowel sounds normal; no masses,  no organomegaly Extremities: extremities normal, atraumatic, no cyanosis or edema Pulses: 2+ and symmetric Skin: Skin color, texture, turgor normal. No rashes or lesions Lymph nodes: Cervical, supraclavicular, and axillary nodes normal. Neurologic: Grossly normal      11/18/2022   12:55 PM 04/07/2022    1:53 PM 09/30/2021    3:42 PM  Depression screen PHQ 2/9  Decreased Interest 0 0 0  Down, Depressed, Hopeless 0 0 0  PHQ - 2 Score 0 0 0  Altered sleeping 0  0  Tired, decreased energy 0  2  Change in appetite 0  0  Feeling bad or failure about yourself  0  0  Trouble concentrating 0  0  Moving slowly or fidgety/restless 0  0  Suicidal thoughts 0  0  PHQ-9 Score 0  2  Difficult doing work/chores   Somewhat difficult      11/18/2022   12:56 PM 09/30/2021    3:43 PM  GAD 7 : Generalized Anxiety Score  Nervous, Anxious, on Edge 0 0   Control/stop worrying 0 0  Worry too much - different things 0 0  Trouble relaxing 0 0  Restless 0 0  Easily annoyed or irritable 0 0  Afraid - awful might happen 0 0  Total GAD 7 Score 0 0  Anxiety Difficulty Not difficult at all Not difficult at all     Assessment/ Plan: Renold Don here for annual physical exam.   Annual physical exam  Restless legs syndrome (RLS) - Plan: CMP14+EGFR, TSH, CBC with Differential/Platelet, rOPINIRole (REQUIP) 0.5 MG tablet  Pure hypertriglyceridemia - Plan: CMP14+EGFR, Lipid panel, atorvastatin (LIPITOR) 80 MG tablet, T4, free  History of melanoma - Plan: CBC with Differential/Platelet  Age-related osteoporosis without current pathological fracture - Plan: CMP14+EGFR, TSH, VITAMIN D 25 Hydroxy (Vit-D Deficiency, Fractures), risedronate (ACTONEL) 150 MG tablet  Low energy - Plan: Vitamin B12,  T4, free  Shingles and influenza vaccination administered.  May follow-up in 4 to 6 months for repeat shingles vaccine  Restless leg is stable.  Check nonfasting lab.  Continue statin, Requip  Check CBC given history of melanoma.  Continue yearly full-body exams.  Continue sunscreen  Check vitamin D, calcium, thyroid.  Will start Actonel once monthly.  Will hold off on initiation until after vitamin D lab is reviewed.  Patient voiced good understanding.  Plan for repeat DEXA scan next year.  I gave her handout on strengthening exercises and the goal vitamin D and calcium levels  Suspect low energy may be multifactorial but will rule out metabolic etiology.  Encouraged regular physical exercise and balanced diet.  Counseled on healthy lifestyle choices, including diet (rich in fruits, vegetables and lean meats and low in salt and simple carbohydrates) and exercise (at least 30 minutes of moderate physical activity daily).  Patient to follow up 1 year  Taaj Hurlbut M. Nadine Counts, DO

## 2022-11-18 NOTE — Patient Instructions (Addendum)
Actonel sent to pharmacy for bones. DO NOT start until instructed to do so.  We need to make sure your vitamin D is sufficient first.  Osteoporosis  Osteoporosis is when the bones get thin and weak. This can cause your bones to break (fracture) more easily. What are the causes? The exact cause of this condition is not known. What increases the risk? Having family members with this condition. Not eating enough healthy foods. Taking certain medicines. Being female. Being age 68 or older. Smoking or using other products that contain nicotine or tobacco, such as e-cigarettes or chewing tobacco. Not exercising. Being of European or Asian ancestry. Having a small body frame. What are the signs or symptoms? A broken bone might be the first sign, especially if the break results from a fall or injury that usually would not cause a bone to break. Other signs and symptoms include: Pain in the neck or low back. Being hunched over (stooped posture). Getting shorter. How is this treated? Eating more foods with more calcium and vitamin D in them. Doing exercises. Stopping tobacco use. Limiting how much alcohol you drink. Taking medicines to slow bone loss or help make the bones stronger. Taking supplements of calcium and vitamin D every day. Taking medicines to replace chemicals in the body (hormone replacement medicines). Monitoring your levels of calcium and vitamin D. The goal of treatment is to strengthen your bones and lower your risk for a bone break. Follow these instructions at home: Eating and drinking Eat plenty of calcium and vitamin D. These nutrients are good for your bones. Good sources of calcium and vitamin D include: Some fish, such as salmon and tuna. Foods that have calcium and vitamin D added to them (fortified foods), such as some breakfast cereals. Egg yolks. Cheese. Liver.  Activity Do exercises as told by your doctor. Ask your doctor what exercises are safe for  you. You should do: Exercises that make your muscles work to hold your body weight up (weight-bearing exercises). These include tai chi, yoga, and walking. Exercises to make your muscles stronger. One example is lifting weights. Lifestyle Do not drink alcohol if: Your doctor tells you not to drink. You are pregnant, may be pregnant, or are planning to become pregnant. If you drink alcohol: Limit how much you use to: 0-1 drink a day for women. 0-2 drinks a day for men. Know how much alcohol is in your drink. In the [place] one drink equals one 12 oz bottle of beer (355 mL), one 5 oz glass of wine (148 mL), or one 1 oz glass of hard liquor (44 mL). Do not smoke or use any products that contain nicotine or tobacco. If you need help quitting, ask your doctor. Preventing falls Use tools to help you move around (mobility aids) as needed. These include canes, walkers, scooters, and crutches. Keep rooms well-lit. Put away things on the floor that could make you trip. These include cords and rugs. Install safety rails on stairs. Install grab bars in bathrooms. Use rubber mats in slippery areas, like bathrooms. Wear shoes that: Fit you well. Support your feet. Have closed toes. Have rubber soles or low heels. Tell your doctor about all of the medicines you are taking. Some medicines can make you more likely to fall. General instructions Take over-the-counter and prescription medicines only as told by your doctor. Keep all follow-up visits. Contact a doctor if: You have not been tested (screened) for osteoporosis and you are: A woman who is  age 68 or older. A man who is age 19 or older. Get help right away if: You fall. You get hurt. Summary Osteoporosis happens when your bones get thin and weak. Weak bones can break (fracture) more easily. Eat plenty of calcium and vitamin D. These are good for your bones. Tell your doctor about all of the medicines that you take. This information is not  intended to replace advice given to you by your health care provider. Make sure you discuss any questions you have with your health care provider. Document Revised: 06/29/2019 Document Reviewed: 06/29/2019 Elsevier Patient Education  2024 [employer].

## 2022-11-18 NOTE — Addendum Note (Signed)
Addended by: Waynette Buttery on: 11/18/2022 01:33 PM   Modules accepted: Orders

## 2022-11-19 LAB — CMP14+EGFR
ALT: 41 [IU]/L — ABNORMAL HIGH (ref 0–32)
AST: 37 [IU]/L (ref 0–40)
Albumin: 4.6 g/dL (ref 3.9–4.9)
Alkaline Phosphatase: 93 [IU]/L (ref 44–121)
BUN/Creatinine Ratio: 14 (ref 12–28)
BUN: 11 mg/dL (ref 8–27)
Bilirubin Total: 0.3 mg/dL (ref 0.0–1.2)
CO2: 24 mmol/L (ref 20–29)
Calcium: 10 mg/dL (ref 8.7–10.3)
Chloride: 101 mmol/L (ref 96–106)
Creatinine, Ser: 0.78 mg/dL (ref 0.57–1.00)
Globulin, Total: 2.5 g/dL (ref 1.5–4.5)
Glucose: 92 mg/dL (ref 70–99)
Potassium: 4.8 mmol/L (ref 3.5–5.2)
Sodium: 139 mmol/L (ref 134–144)
Total Protein: 7.1 g/dL (ref 6.0–8.5)
eGFR: 83 mL/min/{1.73_m2} (ref >59)

## 2022-11-19 LAB — VITAMIN B12: Vitamin B-12: 1495 pg/mL — ABNORMAL HIGH (ref 232–1245)

## 2022-11-19 LAB — CBC WITH DIFFERENTIAL/PLATELET
Basophils Absolute: 0 10*3/uL (ref 0.0–0.2)
Basos: 1 %
EOS (ABSOLUTE): 0.2 10*3/uL (ref 0.0–0.4)
Eos: 2 %
Hematocrit: 43.7 % (ref 34.0–46.6)
Hemoglobin: 14.5 g/dL (ref 11.1–15.9)
Immature Grans (Abs): 0 10*3/uL (ref 0.0–0.1)
Immature Granulocytes: 0 %
Lymphocytes Absolute: 2.5 10*3/uL (ref 0.7–3.1)
Lymphs: 38 %
MCH: 31.8 pg (ref 26.6–33.0)
MCHC: 33.2 g/dL (ref 31.5–35.7)
MCV: 96 fL (ref 79–97)
Monocytes Absolute: 0.4 10*3/uL (ref 0.1–0.9)
Monocytes: 6 %
Neutrophils Absolute: 3.5 10*3/uL (ref 1.4–7.0)
Neutrophils: 53 %
Platelets: 306 10*3/uL (ref 150–450)
RBC: 4.56 x10E6/uL (ref 3.77–5.28)
RDW: 12.6 % (ref 11.7–15.4)
WBC: 6.6 10*3/uL (ref 3.4–10.8)

## 2022-11-19 LAB — LIPID PANEL
Chol/HDL Ratio: 3.5 ratio (ref 0.0–4.4)
Cholesterol, Total: 194 mg/dL (ref 100–199)
HDL: 55 mg/dL (ref >39)
LDL Chol Calc (NIH): 114 mg/dL — ABNORMAL HIGH (ref 0–99)
Triglycerides: 141 mg/dL (ref 0–149)
VLDL Cholesterol Cal: 25 mg/dL (ref 5–40)

## 2022-11-19 LAB — TSH: TSH: 1.58 u[IU]/mL (ref 0.450–4.500)

## 2022-11-19 LAB — VITAMIN D 25 HYDROXY (VIT D DEFICIENCY, FRACTURES): Vit D, 25-Hydroxy: 37.1 ng/mL (ref 30.0–100.0)

## 2022-11-19 LAB — T4, FREE: Free T4: 1.09 ng/dL (ref 0.82–1.77)

## 2022-11-23 ENCOUNTER — Telehealth: Payer: Self-pay | Admitting: Family Medicine

## 2022-11-23 DIAGNOSIS — M810 Age-related osteoporosis without current pathological fracture: Secondary | ICD-10-CM

## 2022-11-23 MED ORDER — ALENDRONATE SODIUM 70 MG PO TABS: 70.0000 mg | ORAL_TABLET | ORAL | 4 refills | Status: DC

## 2022-11-23 NOTE — Telephone Encounter (Signed)
Replaced with fosamax

## 2023-03-22 ENCOUNTER — Ambulatory Visit: Payer: Medicare Other

## 2023-04-08 ENCOUNTER — Ambulatory Visit (INDEPENDENT_AMBULATORY_CARE_PROVIDER_SITE_OTHER): Payer: Medicare Other

## 2023-04-08 VITALS — Ht 61.0 in | Wt 167.0 lb

## 2023-04-08 DIAGNOSIS — Z0000 Encounter for general adult medical examination without abnormal findings: Secondary | ICD-10-CM

## 2023-04-08 NOTE — Progress Notes (Signed)
 Subjective:   Taylor Craig is a 68 y.o. who presents for a Medicare Wellness preventive visit.  Visit Complete: Virtual I connected with  Taylor Craig on 04/08/23 by a audio enabled telemedicine application and verified that I am speaking with the correct person using two identifiers.  Patient Location: Home  Provider Location: Home Office  I discussed the limitations of evaluation and management by telemedicine. The patient expressed understanding and agreed to proceed.  Vital Signs: Because this visit was a virtual/telehealth visit, some criteria may be missing or patient reported. Any vitals not documented were not able to be obtained and vitals that have been documented are patient reported.  VideoDeclined- This patient declined Librarian, academic. Therefore the visit was completed with audio only.  Persons Participating in Visit: Patient.  AWV Questionnaire: No: Patient Medicare AWV questionnaire was not completed prior to this visit.  Cardiac Risk Factors include: advanced age (>63men, >34 women);dyslipidemia     Objective:    Today's Vitals   04/08/23 1341  Weight: 167 lb (75.8 kg)  Height: 5\' 1"  (1.549 m)   Body mass index is 31.55 kg/m.     04/08/2023    1:44 PM 04/07/2022    1:53 PM 08/15/2020    4:26 PM 08/23/2014    5:45 PM 07/16/2014   10:33 AM  Advanced Directives  Does Patient Have a Medical Advance Directive? No No No No No  Would patient like information on creating a medical advance directive? Yes (MAU/Ambulatory/Procedural Areas - Information given) No - Patient declined No - Patient declined  No - patient declined information    Current Medications (verified) Outpatient Encounter Medications as of 04/08/2023  Medication Sig   alendronate (FOSAMAX) 70 MG tablet Take 1 tablet (70 mg total) by mouth every 7 (seven) days. Take with a full glass of water on an empty stomach.   atorvastatin (LIPITOR) 80 MG tablet TAKE 1  TABLET BY MOUTH DAILY IN  THE EVENING   cyanocobalamin 1000 MCG tablet Take 1,000 mcg by mouth daily.   Multiple Vitamin (MULTIVITAMIN WITH MINERALS) TABS tablet Take 1 tablet by mouth daily.   rOPINIRole (REQUIP) 0.5 MG tablet Take 1 tablet (0.5 mg total) by mouth at bedtime.   No facility-administered encounter medications on file as of 04/08/2023.    Allergies (verified) Patient has no known allergies.   History: Past Medical History:  Diagnosis Date   Cancer (HCC) 2004   melanoma    Hyperlipemia    Past Surgical History:  Procedure Laterality Date   ABDOMINAL HYSTERECTOMY     BREAST EXCISIONAL BIOPSY Left    SKIN CANCER EXCISION     Family History  Problem Relation Age of Onset   Breast cancer Neg Hx    Social History   Socioeconomic History   Marital status: Married    Spouse name: Therapist, nutritional   Number of children: 2   Years of education: Not on file   Highest education level: Not on file  Occupational History   Occupation: retired  Tobacco Use   Smoking status: Never   Smokeless tobacco: Never  Vaping Use   Vaping status: Never Used  Substance and Sexual Activity   Alcohol use: No   Drug use: No   Sexual activity: Yes  Other Topics Concern   Not on file  Social History Narrative   2 sons - one lives with them   Social Drivers of Health   Financial Resource Strain: Low Risk  (  04/08/2023)   Overall Financial Resource Strain (CARDIA)    Difficulty of Paying Living Expenses: Not hard at all  Food Insecurity: No Food Insecurity (04/08/2023)   Hunger Vital Sign    Worried About Running Out of Food in the Last Year: Never true    Ran Out of Food in the Last Year: Never true  Transportation Needs: No Transportation Needs (04/08/2023)   PRAPARE - Administrator, Civil Service (Medical): No    Lack of Transportation (Non-Medical): No  Physical Activity: Insufficiently Active (04/08/2023)   Exercise Vital Sign    Days of Exercise per Week: 3 days     Minutes of Exercise per Session: 30 min  Stress: No Stress Concern Present (04/08/2023)   [employer] of Occupational Health - Occupational Stress Questionnaire    Feeling of Stress : Not at all  Social Connections: Moderately Integrated (04/08/2023)   Social Connection and Isolation Panel [NHANES]    Frequency of Communication with Friends and Family: More than three times a week    Frequency of Social Gatherings with Friends and Family: Three times a week    Attends Religious Services: More than 4 times per year    Active Member of Clubs or Organizations: No    Attends Banker Meetings: Never    Marital Status: Married    Tobacco Counseling Counseling given: Not Answered    Clinical Intake:  Pre-visit preparation completed: Yes  Pain : No/denies pain     Diabetes: No  How often do you need to have someone help you when you read instructions, pamphlets, or other written materials from your doctor or pharmacy?: 1 - Never  Interpreter Needed?: No  Information entered by :: Kandis Fantasia LPN   Activities of Daily Living     04/08/2023    1:44 PM  In your present state of health, do you have any difficulty performing the following activities:  Hearing? 0  Vision? 0  Difficulty concentrating or making decisions? 0  Walking or climbing stairs? 0  Dressing or bathing? 0  Doing errands, shopping? 0  Preparing Food and eating ? N  Using the Toilet? N  In the past six months, have you accidently leaked urine? N  Do you have problems with loss of bowel control? N  Managing your Medications? N  Managing your Finances? N  Housekeeping or managing your Housekeeping? N    Patient Care Team: Raliegh Ip, DO as PCP - General (Family Medicine)  Indicate any recent Medical Services you may have received from other than [facility] providers in the past year (date may be approximate).     Assessment:   This is a routine wellness examination for  [facility].  Hearing/Vision screen Hearing Screening - Comments:: Denies hearing difficulties   Vision Screening - Comments:: Wears rx glasses - up to date with routine eye exams with [facility]. Wyn Forster     Goals Addressed             This Visit's Progress    Remain active and independent         Depression Screen     04/08/2023    1:43 PM 11/18/2022   12:55 PM 04/07/2022    1:53 PM 09/30/2021    3:42 PM 09/25/2020    1:13 PM 08/15/2020    4:25 PM 09/20/2019    1:07 PM  PHQ 2/9 Scores  PHQ - 2 Score 0 0 0 0 0 0 0  PHQ- 9  Score  0  2       Fall Risk     04/08/2023    1:43 PM 11/18/2022   12:55 PM 04/07/2022    1:51 PM 09/30/2021    3:42 PM 09/25/2020    1:13 PM  Fall Risk   Falls in the past year? 0 0 0 0 0  Number falls in past yr: 0 0 0    Injury with Fall? 0 0 0    Risk for fall due to : No Fall Risks No Fall Risks No Fall Risks    Follow up Falls prevention discussed;Education provided;Falls evaluation completed Education provided Falls prevention discussed      MEDICARE RISK AT HOME:  Medicare Risk at Home Any stairs in or around the home?: No If so, are there any without handrails?: No Home free of loose throw rugs in walkways, pet beds, electrical cords, etc?: Yes Adequate lighting in your home to reduce risk of falls?: Yes Life alert?: No Use of a cane, walker or w/c?: No Grab bars in the bathroom?: Yes Shower chair or bench in shower?: No Elevated toilet seat or a handicapped toilet?: Yes  TIMED UP AND GO:  Was the test performed?  No  Cognitive Function: 6CIT completed        04/08/2023    1:44 PM 04/07/2022    1:54 PM 08/15/2020    4:27 PM  6CIT Screen  What Year? 0 points 0 points 0 points  What month? 0 points 0 points 0 points  What time? 0 points 0 points 0 points  Count back from 20 0 points 0 points 0 points  Months in reverse 0 points 0 points 0 points  Repeat phrase 0 points 0 points 0 points  Total Score 0 points 0 points 0 points     Immunizations Immunization History  Administered Date(s) Administered   Fluad Quad(high Dose 65+) 01/11/2020, 01/09/2021   Fluad Trivalent(High Dose 65+) 11/18/2022   Influenza,inj,Quad PF,6+ Mos 11/16/2012, 11/27/2014, 11/25/2015, 10/22/2016, 10/28/2017, 09/30/2018   Influenza-Unspecified 11/16/2012   Moderna Sars-Covid-2 Vaccination 04/06/2019, 04/29/2019, 12/28/2019   Pneumococcal Conjugate-13 09/20/2019   Pneumococcal Polysaccharide-23 09/25/2020   Tdap 07/27/2016   Zoster Recombinant(Shingrix) 09/30/2021, 11/18/2022    Screening Tests Health Maintenance  Topic Date Due   COVID-19 Vaccine (4 - 2024-25 season) 09/27/2022   MAMMOGRAM  09/23/2023   DEXA SCAN  10/01/2023   Medicare Annual Wellness (AWV)  04/07/2024   Fecal DNA (Cologuard)  10/13/2024   DTaP/Tdap/Td (2 - Td or Tdap) 07/28/2026   Pneumonia Vaccine 93+ Years old  Completed   INFLUENZA VACCINE  Completed   Hepatitis C Screening  Completed   Zoster Vaccines- Shingrix  Completed   HPV VACCINES  Aged Out    Health Maintenance  Health Maintenance Due  Topic Date Due   COVID-19 Vaccine (4 - 2024-25 season) 09/27/2022    Additional Screening:  Vision Screening: Recommended annual ophthalmology exams for early detection of glaucoma and other disorders of the eye.  Dental Screening: Recommended annual dental exams for proper oral hygiene  Community Resource Referral / Chronic Care Management: CRR required this visit?  No   CCM required this visit?  No     Plan:     I have personally reviewed and noted the following in the patient's chart:   Medical and social history Use of alcohol, tobacco or illicit drugs  Current medications and supplements including opioid prescriptions. Patient is not currently taking opioid prescriptions. Functional ability  and status Nutritional status Physical activity Advanced directives List of other physicians Hospitalizations, surgeries, and ER visits in previous  12 months Vitals Screenings to include cognitive, depression, and falls Referrals and appointments  In addition, I have reviewed and discussed with patient certain preventive protocols, quality metrics, and best practice recommendations. A written personalized care plan for preventive services as well as general preventive health recommendations were provided to patient.     Kandis Fantasia [place], [place]   [id number]   After Visit Summary: (MyChart) Due to this being a telephonic visit, the after visit summary with patients personalized plan was offered to patient via MyChart   Notes: Nothing significant to report at this time.

## 2023-04-08 NOTE — Patient Instructions (Signed)
 Taylor Craig , Thank you for taking time to come for your Medicare Wellness Visit. I appreciate your ongoing commitment to your health goals. Please review the following plan we discussed and let me know if I can assist you in the future.   Referrals/Orders/Follow-Ups/Clinician Recommendations: Aim for 30 minutes of exercise or brisk walking, 6-8 glasses of water, and 5 servings of fruits and vegetables each day.  This is a list of the screening recommended for you and due dates:  Health Maintenance  Topic Date Due   COVID-19 Vaccine (4 - 2024-25 season) 09/27/2022   Mammogram  09/23/2023   DEXA scan (bone density measurement)  10/01/2023   Medicare Annual Wellness Visit  04/07/2024   Cologuard (Stool DNA test)  10/13/2024   DTaP/Tdap/Td vaccine (2 - Td or Tdap) 07/28/2026   Pneumonia Vaccine  Completed   Flu Shot  Completed   Hepatitis C Screening  Completed   Zoster (Shingles) Vaccine  Completed   HPV Vaccine  Aged Out    Advanced directives: (ACP Link)Information on Advanced Care Planning can be found at [facility] Advance Health Care Directives Advance Health Care Directives. [email or web address]   Next Medicare Annual Wellness Visit scheduled for next year: Yes

## 2023-05-26 ENCOUNTER — Telehealth: Payer: Self-pay | Admitting: Family Medicine

## 2023-05-26 NOTE — Telephone Encounter (Unsigned)
 Copied from CRM [id number]. Topic: Medical Record Request - Provider/Facility Request >> May 26, 2023 11:14 AM Tisa Forester wrote: Reason for CRM:  member call [employer] and stated that patient never seen Dr. Evaristo Hind in general or any date of service on 04/08/23   Mau from [employer] - checking on status of claim or billing  ,Dr. Evaristo Hind submited  a claim was fax  date of service 04/08/23   Please call provider line is : [phone]

## 2023-08-27 ENCOUNTER — Other Ambulatory Visit: Payer: Self-pay | Admitting: Family Medicine

## 2023-08-27 DIAGNOSIS — Z1231 Encounter for screening mammogram for malignant neoplasm of breast: Secondary | ICD-10-CM

## 2023-09-22 ENCOUNTER — Ambulatory Visit: Admission: RE | Admit: 2023-09-22 | Source: Ambulatory Visit

## 2023-10-04 ENCOUNTER — Ambulatory Visit
Admission: RE | Admit: 2023-10-04 | Discharge: 2023-10-04 | Disposition: A | Discharge status: Home / Self Care | Source: Ambulatory Visit | Attending: Family Medicine | Admitting: Family Medicine

## 2023-10-04 DIAGNOSIS — Z1231 Encounter for screening mammogram for malignant neoplasm of breast: Secondary | ICD-10-CM

## 2023-11-24 ENCOUNTER — Other Ambulatory Visit

## 2023-11-24 ENCOUNTER — Encounter: Payer: Self-pay | Admitting: Family Medicine

## 2023-11-24 ENCOUNTER — Ambulatory Visit (INDEPENDENT_AMBULATORY_CARE_PROVIDER_SITE_OTHER): Payer: Medicare Other | Admitting: Family Medicine

## 2023-11-24 VITALS — BP 130/71 | HR 81 | Temp 97.2°F | Ht 61.0 in | Wt 169.5 lb

## 2023-11-24 DIAGNOSIS — M810 Age-related osteoporosis without current pathological fracture: Secondary | ICD-10-CM

## 2023-11-24 DIAGNOSIS — G2581 Restless legs syndrome: Secondary | ICD-10-CM | POA: Diagnosis not present

## 2023-11-24 DIAGNOSIS — Z23 Encounter for immunization: Secondary | ICD-10-CM

## 2023-11-24 DIAGNOSIS — Z0001 Encounter for general adult medical examination with abnormal findings: Secondary | ICD-10-CM | POA: Diagnosis not present

## 2023-11-24 DIAGNOSIS — Z0000 Encounter for general adult medical examination without abnormal findings: Secondary | ICD-10-CM

## 2023-11-24 DIAGNOSIS — E782 Mixed hyperlipidemia: Secondary | ICD-10-CM

## 2023-11-24 DIAGNOSIS — E66811 Obesity, class 1: Secondary | ICD-10-CM

## 2023-11-24 DIAGNOSIS — E781 Pure hyperglyceridemia: Secondary | ICD-10-CM

## 2023-11-24 MED ORDER — ROPINIROLE HCL 0.5 MG PO TABS: 0.5000 mg | ORAL_TABLET | Freq: Every day | ORAL | 3 refills | Status: AC

## 2023-11-24 MED ORDER — ATORVASTATIN CALCIUM 80 MG PO TABS: ORAL_TABLET | ORAL | 3 refills | Status: AC

## 2023-11-24 MED ORDER — ALENDRONATE SODIUM 70 MG PO TABS: 70.0000 mg | ORAL_TABLET | ORAL | 4 refills | Status: AC

## 2023-11-24 NOTE — Patient Instructions (Signed)
Coronary Calcium Scan A coronary calcium scan is an imaging test used to look for deposits of plaque in the inner lining of the blood vessels of the heart (coronary arteries). Plaque is made up of calcium, protein, and fatty substances. These deposits of plaque can partly clog and narrow the coronary arteries without producing any symptoms or warning signs. This puts a person at risk for a heart attack. A coronary calcium scan is performed using a computed tomography (CT) scanner machine without using a dye (contrast). This test is recommended for people who are at moderate risk for heart disease. The test can find plaque deposits before symptoms develop. Tell a health care provider about: Any allergies you have. All medicines you are taking, including vitamins, herbs, eye drops, creams, and over-the-counter medicines. Any problems you or family members have had with anesthetic medicines. Any bleeding problems you have. Any surgeries you have had. Any medical conditions you have. Whether you are pregnant or may be pregnant. What are the risks? Generally, this is a safe procedure. However, problems may occur, including: Harm to a pregnant woman and her unborn baby. This test involves the use of radiation. Radiation exposure can be dangerous to a pregnant woman and her unborn baby. If you are pregnant or think you may be pregnant, you should not have this procedure done. A slight increase in the risk of cancer. This is because of the radiation involved in the test. The amount of radiation from one test is similar to the amount of radiation you are naturally exposed to over one year. What happens before the procedure? Ask your health care provider for any specific instructions on how to prepare for this procedure. You may be asked to avoid products that contain caffeine, tobacco, or nicotine for 4 hours before the procedure. What happens during the procedure?  You will undress and remove any jewelry  from your neck or chest. You may need to remove hearing aides and dentures. Women may need to remove their bras. You will put on a hospital gown. Sticky electrodes will be placed on your chest. The electrodes will be connected to an electrocardiogram (ECG) machine to record a tracing of the electrical activity of your heart. You will lie down on your back on a curved bed that is attached to the CT scanner. You may be given medicine to slow down your heart rate so that clear pictures can be created. You will be moved into the CT scanner, and the CT scanner will take pictures of your heart. During this time, you will be asked to lie still and hold your breath for 10-20 seconds at a time while each picture of your heart is being taken. The procedure may vary among health care providers and hospitals. What can I expect after the procedure? You can return to your normal activities. It is up to you to get the results of your procedure. Ask your health care provider, or the department that is doing the procedure, when your results will be ready. Summary A coronary calcium scan is an imaging test used to look for deposits of plaque in the inner lining of the blood vessels of the heart. Plaque is made up of calcium, protein, and fatty substances. A coronary calcium scan is performed using a CT scanner machine without contrast. Generally, this is a safe procedure. Tell your health care provider if you are pregnant or may be pregnant. Ask your health care provider for any specific instructions on how to  prepare for this procedure. You can return to your normal activities after the scan is done. This information is not intended to replace advice given to you by your health care provider. Make sure you discuss any questions you have with your health care provider. Document Revised: 12/22/2020 Document Reviewed: 12/22/2020 Elsevier Patient Education  2024 [employer].

## 2023-11-24 NOTE — Progress Notes (Signed)
 Taylor Craig is a 68 y.o. female presents to office today for annual physical exam examination.    She says she is doing really well.  Reports no concerns.  She typically sleeps in most days and is not doing structured physical activity but aims to start walking more regularly.  She notes that restless leg has improved substantially since starting Requip  and she rarely has breakthrough symptoms.  Compliant with atorvastatin  and denies any chest pain, shortness breath, change in exercise tolerance.  Takes her alendronate  most weeks but sometimes does forget because she typically starts her day with coffee and a devotional and skips taking it because she forgot to stay fasting   Occupation: retired, Marital status: married, Substance use: none Health Maintenance Due  Topic Date Due   Influenza Vaccine  08/27/2023   DEXA SCAN  10/01/2023    Immunization History  Administered Date(s) Administered   Fluad Quad(high Dose 65+) 01/11/2020, 01/09/2021   Fluad Trivalent(High Dose 65+) 11/18/2022   Influenza,inj,Quad PF,6+ Mos 11/16/2012, 11/27/2014, 11/25/2015, 10/22/2016, 10/28/2017, 09/30/2018   Influenza-Unspecified 11/16/2012   Moderna Sars-Covid-2 Vaccination 04/06/2019, 04/29/2019, 12/28/2019   Pneumococcal Conjugate-13 09/20/2019   Pneumococcal Polysaccharide-23 09/25/2020   Tdap 07/27/2016   Zoster Recombinant(Shingrix ) 09/30/2021, 11/18/2022   Past Medical History:  Diagnosis Date   Cancer (HCC) 01/26/2002   melanoma    Hyperlipemia    Osteoporosis    Restless leg syndrome    Social History   Socioeconomic History   Marital status: Married    Spouse name: Therapist, Nutritional   Number of children: 2   Years of education: Not on file   Highest education level: Not on file  Occupational History   Occupation: retired  Tobacco Use   Smoking status: Never   Smokeless tobacco: Never  Vaping Use   Vaping status: Never Used  Substance and Sexual Activity   Alcohol use: No   Drug use:  No   Sexual activity: Yes  Other Topics Concern   Not on file  Social History Narrative   2 sons - one lives with them   Christian   Regularly visits [place]   Social Drivers of Health   Financial Resource Strain: Low Risk  (04/08/2023)   Overall Financial Resource Strain (CARDIA)    Difficulty of Paying Living Expenses: Not hard at all  Food Insecurity: No Food Insecurity (04/08/2023)   Hunger Vital Sign    Worried About Running Out of Food in the Last Year: Never true    Ran Out of Food in the Last Year: Never true  Transportation Needs: No Transportation Needs (04/08/2023)   PRAPARE - Administrator, Civil Service (Medical): No    Lack of Transportation (Non-Medical): No  Physical Activity: Insufficiently Active (04/08/2023)   Exercise Vital Sign    Days of Exercise per Week: 3 days    Minutes of Exercise per Session: 30 min  Stress: No Stress Concern Present (04/08/2023)   [employer] of Occupational Health - Occupational Stress Questionnaire    Feeling of Stress : Not at all  Social Connections: Moderately Integrated (04/08/2023)   Social Connection and Isolation Panel    Frequency of Communication with Friends and Family: More than three times a week    Frequency of Social Gatherings with Friends and Family: Three times a week    Attends Religious Services: More than 4 times per year    Active Member of Clubs or Organizations: No    Attends Banker Meetings:  Never    Marital Status: Married  Catering Manager Violence: Not At Risk (04/08/2023)   Humiliation, Afraid, Rape, and Kick questionnaire    Fear of Current or Ex-Partner: No    Emotionally Abused: No    Physically Abused: No    Sexually Abused: No   Past Surgical History:  Procedure Laterality Date   ABDOMINAL HYSTERECTOMY     BREAST EXCISIONAL BIOPSY Left    SKIN CANCER EXCISION     Family History  Problem Relation Age of Onset   Breast cancer Neg Hx     Current Outpatient  Medications:    alendronate  (FOSAMAX ) 70 MG tablet, Take 1 tablet (70 mg total) by mouth every 7 (seven) days. Take with a full glass of water on an empty stomach., Disp: 12 tablet, Rfl: 4   atorvastatin  (LIPITOR) 80 MG tablet, TAKE 1 TABLET BY MOUTH DAILY IN  THE EVENING, Disp: 100 tablet, Rfl: 3   Multiple Vitamin (MULTIVITAMIN WITH MINERALS) TABS tablet, Take 1 tablet by mouth daily., Disp: , Rfl:    rOPINIRole  (REQUIP ) 0.5 MG tablet, Take 1 tablet (0.5 mg total) by mouth at bedtime., Disp: 100 tablet, Rfl: 3   cyanocobalamin  1000 MCG tablet, Take 1,000 mcg by mouth daily. (Patient not taking: Reported on 11/24/2023), Disp: , Rfl:   No Known Allergies   ROS: Review of Systems Pertinent items noted in HPI and remainder of comprehensive ROS otherwise negative.    Physical exam BP 130/71   Pulse 81   Temp (!) 97.2 F (36.2 C)   Ht 5' 1 (1.549 m)   Wt 169 lb 8 oz (76.9 kg)   SpO2 96%   BMI 32.03 kg/m  General appearance: alert, cooperative, appears stated age, no distress, and moderately obese Head: Normocephalic, without obvious abnormality, atraumatic Eyes: negative findings: lids and lashes normal, conjunctivae and sclerae normal, corneas clear, and pupils equal, round, reactive to light and accomodation Ears: normal TM's and external ear canals both ears Nose: Nares normal. Septum midline. Mucosa normal. No drainage or sinus tenderness. Throat: lips, mucosa, and tongue normal; teeth and gums normal Neck: no adenopathy, no carotid bruit, no JVD, supple, symmetrical, trachea midline, and thyroid  not enlarged, symmetric, no tenderness/mass/nodules Back: Decreased extension of the cervical spine and mild increased thoracic kyphosis.  She ambulates independently with normal gait and station. Lungs: clear to auscultation bilaterally Heart: regular rate and rhythm, S1, S2 normal, no murmur, click, rub or gallop Abdomen: soft, non-tender; bowel sounds normal; no masses,  no  organomegaly Extremities: extremities normal, atraumatic, no cyanosis or edema Pulses: 2+ and symmetric Skin: Skin color, texture, turgor normal. No rashes or lesions Lymph nodes: No supraclavicular or cervical lymphadenopathy Neurologic: Grossly normal      11/24/2023   12:56 PM 04/08/2023    1:43 PM 11/18/2022   12:55 PM  Depression screen PHQ 2/9  Decreased Interest 0 0 0  Down, Depressed, Hopeless 0 0 0  PHQ - 2 Score 0 0 0  Altered sleeping 0  0  Tired, decreased energy 0  0  Change in appetite 0  0  Feeling bad or failure about yourself  0  0  Trouble concentrating 0  0  Moving slowly or fidgety/restless 0  0  Suicidal thoughts 0  0  PHQ-9 Score 0  0  Difficult doing work/chores Not difficult at all        11/24/2023   12:56 PM 11/18/2022   12:56 PM 09/30/2021    3:43 PM  GAD 7 : Generalized Anxiety Score  Nervous, Anxious, on Edge 0 0 0  Control/stop worrying 0 0 0  Worry too much - different things 0 0 0  Trouble relaxing 0 0 0  Restless 0 0 0  Easily annoyed or irritable 0 0 0  Afraid - awful might happen 0 0 0  Total GAD 7 Score 0 0 0  Anxiety Difficulty Not difficult at all Not difficult at all Not difficult at all    No results found for this or any previous visit (from the past 2160 hours).   Assessment/ Plan: Rollene JINNY Moles here for annual physical exam.   Annual physical exam  Age-related osteoporosis without current pathological fracture - Plan: CMP14+EGFR, VITAMIN D  25 Hydroxy (Vit-D Deficiency, Fractures), DG [facility] DEXA, alendronate  (FOSAMAX ) 70 MG tablet  Mixed hyperlipidemia - Plan: Lipid Panel, CMP14+EGFR, TSH  Restless legs syndrome (RLS) - Plan: TSH, Iron, TIBC and Ferritin Panel, CBC with Differential, rOPINIRole  (REQUIP ) 0.5 MG tablet  Obesity (BMI 30.0-34.9)  Pure hypertriglyceridemia - Plan: atorvastatin  (LIPITOR) 80 MG tablet   Influenza vaccination administered.  She will set up DEXA scan.  She is otherwise up-to-date on  preventative healthcare measures  Fasting labs collected today.  Medications have been renewed.  All chronic issues are stable.  Encouraged physical exercise and discussed Silver sneakers program today  Counseled on healthy lifestyle choices, including diet (rich in fruits, vegetables and lean meats and low in salt and simple carbohydrates) and exercise (at least 30 minutes of moderate physical activity daily).  Patient to follow up 1 year  Kalana Yust M. Jolinda, DO

## 2023-11-25 LAB — CBC WITH DIFFERENTIAL/PLATELET
Basophils Absolute: 0 x10E3/uL (ref 0.0–0.2)
Basos: 0 %
EOS (ABSOLUTE): 0.2 x10E3/uL (ref 0.0–0.4)
Eos: 2 %
Hematocrit: 43.6 % (ref 34.0–46.6)
Hemoglobin: 14.5 g/dL (ref 11.1–15.9)
Immature Grans (Abs): 0 x10E3/uL (ref 0.0–0.1)
Immature Granulocytes: 0 %
Lymphocytes Absolute: 2.8 x10E3/uL (ref 0.7–3.1)
Lymphs: 40 %
MCH: 31.7 pg (ref 26.6–33.0)
MCHC: 33.3 g/dL (ref 31.5–35.7)
MCV: 95 fL (ref 79–97)
Monocytes Absolute: 0.4 x10E3/uL (ref 0.1–0.9)
Monocytes: 6 %
Neutrophils Absolute: 3.6 x10E3/uL (ref 1.4–7.0)
Neutrophils: 52 %
Platelets: 303 x10E3/uL (ref 150–450)
RBC: 4.58 x10E6/uL (ref 3.77–5.28)
RDW: 12.9 % (ref 11.7–15.4)
WBC: 7 x10E3/uL (ref 3.4–10.8)

## 2023-11-25 LAB — VITAMIN D 25 HYDROXY (VIT D DEFICIENCY, FRACTURES): Vit D, 25-Hydroxy: 34.6 ng/mL (ref 30.0–100.0)

## 2023-11-25 LAB — LIPID PANEL
Chol/HDL Ratio: 3.7 ratio (ref 0.0–4.4)
Cholesterol, Total: 179 mg/dL (ref 100–199)
HDL: 49 mg/dL (ref >39)
LDL Chol Calc (NIH): 103 mg/dL — ABNORMAL HIGH (ref 0–99)
Triglycerides: 157 mg/dL — ABNORMAL HIGH (ref 0–149)
VLDL Cholesterol Cal: 27 mg/dL (ref 5–40)

## 2023-11-25 LAB — IRON,TIBC AND FERRITIN PANEL
Ferritin: 187 ng/mL — ABNORMAL HIGH (ref 15–150)
Iron Saturation: 31 % (ref 15–55)
Iron: 96 ug/dL (ref 27–139)
Total Iron Binding Capacity: 306 ug/dL (ref 250–450)
UIBC: 210 ug/dL (ref 118–369)

## 2023-11-25 LAB — CMP14+EGFR
ALT: 38 IU/L — ABNORMAL HIGH (ref 0–32)
AST: 35 IU/L (ref 0–40)
Albumin: 4.5 g/dL (ref 3.9–4.9)
Alkaline Phosphatase: 73 IU/L (ref 49–135)
BUN/Creatinine Ratio: 17 (ref 12–28)
BUN: 14 mg/dL (ref 8–27)
Bilirubin Total: 0.4 mg/dL (ref 0.0–1.2)
CO2: 23 mmol/L (ref 20–29)
Calcium: 9.9 mg/dL (ref 8.7–10.3)
Chloride: 102 mmol/L (ref 96–106)
Creatinine, Ser: 0.84 mg/dL (ref 0.57–1.00)
Globulin, Total: 2.7 g/dL (ref 1.5–4.5)
Glucose: 91 mg/dL (ref 70–99)
Potassium: 4.6 mmol/L (ref 3.5–5.2)
Sodium: 138 mmol/L (ref 134–144)
Total Protein: 7.2 g/dL (ref 6.0–8.5)
eGFR: 75 mL/min/1.73 (ref >59)

## 2023-11-25 LAB — TSH: TSH: 2.19 u[IU]/mL (ref 0.450–4.500)

## 2023-11-29 ENCOUNTER — Ambulatory Visit: Payer: Self-pay | Admitting: Family Medicine

## 2023-11-29 ENCOUNTER — Encounter: Payer: Self-pay | Admitting: Family Medicine

## 2023-11-29 ENCOUNTER — Ambulatory Visit

## 2023-11-29 DIAGNOSIS — M810 Age-related osteoporosis without current pathological fracture: Secondary | ICD-10-CM

## 2023-11-30 ENCOUNTER — Other Ambulatory Visit

## 2023-11-30 DIAGNOSIS — M810 Age-related osteoporosis without current pathological fracture: Secondary | ICD-10-CM

## 2023-12-07 ENCOUNTER — Telehealth: Payer: Self-pay | Admitting: Family Medicine

## 2023-12-07 NOTE — Telephone Encounter (Signed)
 Returned patient call, reviewed Dexa scan results with patient. Patient verbalized understanding. No further questions.

## 2023-12-07 NOTE — Telephone Encounter (Signed)
 Copied from CRM [id number]. Topic: General - Other >> Dec 07, 2023 12:49 PM Taylor Craig wrote: Reason for CRM: Pt stated she is returning someone call from today 12/07/2023.Please call pt at [phone].

## 2024-04-10 ENCOUNTER — Ambulatory Visit: Payer: Self-pay

## 2024-11-27 ENCOUNTER — Encounter: Admitting: Family Medicine
